# Patient Record
Sex: Male | Born: 1995
Health system: Southern US, Community
[De-identification: ages and names within clinical notes are randomized; demographics above are authoritative.]

## PROBLEM LIST (undated history)

## (undated) DIAGNOSIS — Z973 Presence of spectacles and contact lenses: Secondary | ICD-10-CM

## (undated) DIAGNOSIS — C73 Malignant neoplasm of thyroid gland: Secondary | ICD-10-CM

## (undated) HISTORY — PX: NO PAST SURGERIES: SHX2092

---

## 2017-09-15 DIAGNOSIS — M545 Low back pain: Secondary | ICD-10-CM | POA: Diagnosis not present

## 2017-09-15 DIAGNOSIS — M9903 Segmental and somatic dysfunction of lumbar region: Secondary | ICD-10-CM | POA: Diagnosis not present

## 2017-09-15 DIAGNOSIS — M9904 Segmental and somatic dysfunction of sacral region: Secondary | ICD-10-CM | POA: Diagnosis not present

## 2017-09-15 DIAGNOSIS — M9905 Segmental and somatic dysfunction of pelvic region: Secondary | ICD-10-CM | POA: Diagnosis not present

## 2017-11-18 DIAGNOSIS — A084 Viral intestinal infection, unspecified: Secondary | ICD-10-CM | POA: Diagnosis not present

## 2017-12-17 DIAGNOSIS — Z136 Encounter for screening for cardiovascular disorders: Secondary | ICD-10-CM | POA: Diagnosis not present

## 2017-12-17 DIAGNOSIS — Z789 Other specified health status: Secondary | ICD-10-CM | POA: Diagnosis not present

## 2017-12-17 DIAGNOSIS — Z0184 Encounter for antibody response examination: Secondary | ICD-10-CM | POA: Diagnosis not present

## 2017-12-17 DIAGNOSIS — Z0000 Encounter for general adult medical examination without abnormal findings: Secondary | ICD-10-CM | POA: Diagnosis not present

## 2017-12-19 ENCOUNTER — Other Ambulatory Visit: Payer: Self-pay | Admitting: Family Medicine

## 2017-12-19 DIAGNOSIS — R0989 Other specified symptoms and signs involving the circulatory and respiratory systems: Secondary | ICD-10-CM

## 2017-12-25 DIAGNOSIS — S81812A Laceration without foreign body, left lower leg, initial encounter: Secondary | ICD-10-CM | POA: Diagnosis not present

## 2017-12-31 ENCOUNTER — Ambulatory Visit
Admission: RE | Admit: 2017-12-31 | Discharge: 2017-12-31 | Disposition: A | Discharge status: Home / Self Care | Payer: BLUE CROSS/BLUE SHIELD | Source: Ambulatory Visit | Attending: Family Medicine | Admitting: Family Medicine

## 2017-12-31 DIAGNOSIS — R0989 Other specified symptoms and signs involving the circulatory and respiratory systems: Secondary | ICD-10-CM

## 2018-01-08 ENCOUNTER — Other Ambulatory Visit: Payer: Self-pay | Admitting: Family Medicine

## 2018-01-08 DIAGNOSIS — E041 Nontoxic single thyroid nodule: Secondary | ICD-10-CM

## 2018-01-15 ENCOUNTER — Ambulatory Visit
Admission: RE | Admit: 2018-01-15 | Discharge: 2018-01-15 | Disposition: A | Discharge status: Home / Self Care | Payer: BLUE CROSS/BLUE SHIELD | Source: Ambulatory Visit | Attending: Family Medicine | Admitting: Family Medicine

## 2018-01-15 DIAGNOSIS — E041 Nontoxic single thyroid nodule: Secondary | ICD-10-CM

## 2018-01-19 ENCOUNTER — Other Ambulatory Visit: Payer: Self-pay | Admitting: Family Medicine

## 2018-01-19 DIAGNOSIS — E041 Nontoxic single thyroid nodule: Secondary | ICD-10-CM

## 2018-01-27 ENCOUNTER — Ambulatory Visit
Admission: RE | Admit: 2018-01-27 | Discharge: 2018-01-27 | Disposition: A | Discharge status: Home / Self Care | Payer: BLUE CROSS/BLUE SHIELD | Source: Ambulatory Visit | Attending: Family Medicine | Admitting: Family Medicine

## 2018-01-27 ENCOUNTER — Other Ambulatory Visit (HOSPITAL_COMMUNITY)
Admission: RE | Admit: 2018-01-27 | Discharge: 2018-01-27 | Disposition: A | Discharge status: Home / Self Care | Payer: BLUE CROSS/BLUE SHIELD | Source: Ambulatory Visit | Attending: Student | Admitting: Student

## 2018-01-27 DIAGNOSIS — C73 Malignant neoplasm of thyroid gland: Secondary | ICD-10-CM | POA: Diagnosis not present

## 2018-01-27 DIAGNOSIS — E041 Nontoxic single thyroid nodule: Secondary | ICD-10-CM | POA: Diagnosis not present

## 2018-02-03 DIAGNOSIS — C73 Malignant neoplasm of thyroid gland: Secondary | ICD-10-CM | POA: Diagnosis not present

## 2018-03-04 ENCOUNTER — Ambulatory Visit: Payer: Self-pay | Admitting: Surgery

## 2018-03-04 DIAGNOSIS — C73 Malignant neoplasm of thyroid gland: Secondary | ICD-10-CM | POA: Diagnosis not present

## 2018-03-24 NOTE — Patient Instructions (Addendum)
Louis Mckinney  02-23-1996    Your procedure is scheduled on:  03-30-2018   Report to [facility] Main  Entrance, Report to admitting at  5:30 AM    Call this number if you have problems the morning of surgery [phone]                      Remember:   Do not eat food or drink liquids :After Midnight.                   BRUSH YOUR TEETH MORNING OF SURGERY AND RINSE YOUR MOUTH OUT, NO CHEWING GUM CANDY OR MINTS.       Take these medicines the morning of surgery with A SIP OF WATER:   Claritin                                You may not have any metal on your body including hair pins and              piercings               Do not wear jewelry, lotions, powders or perfumes, deodorant                          Men may shave face and neck.   Do not bring valuables to the hospital. [place].  Contacts, dentures or bridgework may not be worn into surgery.  Leave suitcase in the car. After surgery it may be brought to your room.     Special Instructions: N/A          _____________________________________________________________________             [facility] - Preparing for Surgery Before surgery, you can play an important role.  Because skin is not sterile, your skin needs to be as free of germs as possible.  You can reduce the number of germs on your skin by washing with CHG (chlorahexidine gluconate) soap before surgery.  CHG is an antiseptic cleaner which kills germs and bonds with the skin to continue killing germs even after washing. Please DO NOT use if you have an allergy to CHG or antibacterial soaps.  If your skin becomes reddened/irritated stop using the CHG and inform your nurse when you arrive at Short Stay. Do not shave (including legs and underarms) for at least 48 hours prior to the first CHG shower.  You may shave your face/neck. Please follow these instructions carefully:  1.  Shower  with CHG Soap the night before surgery and the  morning of Surgery.  2.  If you choose to wash your hair, wash your hair first as usual with your  normal  shampoo.  3.  After you shampoo, rinse your hair and body thoroughly to remove the  shampoo.                           4.  Use CHG as you would any other liquid soap.  You can apply chg directly  to the skin and wash  Gently with a scrungie or clean washcloth.  5.  Apply the CHG Soap to your body ONLY FROM THE NECK DOWN.   Do not use on face/ open                           Wound or open sores. Avoid contact with eyes, ears mouth and genitals (private parts).                       Wash face,  Genitals (private parts) with your normal soap.             6.  Wash thoroughly, paying special attention to the area where your surgery  will be performed.  7.  Thoroughly rinse your body with warm water from the neck down.  8.  DO NOT shower/wash with your normal soap after using and rinsing off  the CHG Soap.             9.  Pat yourself dry with a clean towel.            10.  Wear clean pajamas.            11.  Place clean sheets on your bed the night of your first shower and do not  sleep with pets. Day of Surgery : Do not apply any lotions/deodorants the morning of surgery.  Please wear clean clothes to the hospital/surgery center.  FAILURE TO FOLLOW THESE INSTRUCTIONS MAY RESULT IN THE CANCELLATION OF YOUR SURGERY PATIENT SIGNATURE_________________________________  NURSE SIGNATURE__________________________________  ________________________________________________________________________

## 2018-03-25 ENCOUNTER — Encounter (HOSPITAL_COMMUNITY)
Admission: RE | Admit: 2018-03-25 | Discharge: 2018-03-25 | Disposition: A | Discharge status: Home / Self Care | Payer: BLUE CROSS/BLUE SHIELD | Source: Ambulatory Visit | Attending: Surgery | Admitting: Surgery

## 2018-03-25 ENCOUNTER — Ambulatory Visit (HOSPITAL_COMMUNITY)
Admission: RE | Admit: 2018-03-25 | Discharge: 2018-03-25 | Disposition: A | Discharge status: Home / Self Care | Payer: BLUE CROSS/BLUE SHIELD | Source: Ambulatory Visit | Attending: Anesthesiology | Admitting: Anesthesiology

## 2018-03-25 ENCOUNTER — Encounter (HOSPITAL_COMMUNITY): Payer: Self-pay

## 2018-03-25 ENCOUNTER — Other Ambulatory Visit: Payer: Self-pay

## 2018-03-25 DIAGNOSIS — C73 Malignant neoplasm of thyroid gland: Secondary | ICD-10-CM | POA: Diagnosis not present

## 2018-03-25 DIAGNOSIS — Z01818 Encounter for other preprocedural examination: Secondary | ICD-10-CM | POA: Insufficient documentation

## 2018-03-25 HISTORY — DX: Presence of spectacles and contact lenses: Z97.3

## 2018-03-25 HISTORY — DX: Malignant neoplasm of thyroid gland: C73

## 2018-03-25 LAB — CBC
HEMATOCRIT: 44.5 % (ref 39.0–52.0)
Hemoglobin: 14.5 g/dL (ref 13.0–17.0)
MCH: 28.9 pg (ref 26.0–34.0)
MCHC: 32.6 g/dL (ref 30.0–36.0)
MCV: 88.8 fL (ref 80.0–100.0)
Platelets: 317 10*3/uL (ref 150–400)
RBC: 5.01 MIL/uL (ref 4.22–5.81)
RDW: 12.6 % (ref 11.5–15.5)
WBC: 10.1 10*3/uL (ref 4.0–10.5)
nRBC: 0 % (ref 0.0–0.2)

## 2018-03-25 MED ORDER — CHLORHEXIDINE GLUCONATE CLOTH 2 % EX PADS
6.0000 | MEDICATED_PAD | Freq: Once | CUTANEOUS | Status: DC
  Filled 2018-03-25: qty 6

## 2018-03-25 NOTE — Progress Notes (Signed)
Final CXR dated 03-25-2018 in epic.

## 2018-03-29 ENCOUNTER — Encounter (HOSPITAL_COMMUNITY): Payer: Self-pay | Admitting: Surgery

## 2018-03-29 DIAGNOSIS — C73 Malignant neoplasm of thyroid gland: Secondary | ICD-10-CM | POA: Diagnosis present

## 2018-03-29 NOTE — H&P (Signed)
General Surgery [facility]  Louis Mckinney Documented: 03/04/2018 11:26 AM Location: [facility] Patient #: [id number] DOB: 08-22-1995 Single / Language: Louis Mckinney / Race: White Male   History of Present Illness Louis Regal MD; 03/04/2018 12:09 PM) The patient is a 22 year old male who presents with thyroid cancer.  CHIEF COMPLAINT: papillary thyroid carcinoma  Patient is referred by Dr. Aura Dials for surgical evaluation and management of papillary thyroid carcinoma. Patient had been noted on routine physical examination to have a carotid bruit. He was sent for duplex examination which found a normal vascular examination but an incidental finding of a thyroid nodule. Patient subsequently underwent thyroid ultrasound on January 15, 2018. This showed a mildly enlarged thyroid gland which was moderately heterogeneous. There was a dominant nodule in the left inferior pole measuring 1.4 cm that was felt to be highly suspicious. Patient underwent fine-needle aspiration biopsy on January 27, 2018. This showed findings consistent with papillary thyroid carcinoma. Patient has no prior history of thyroid disease. He has never been on thyroid medication. He has had no prior head or neck surgery. There is no family history of thyroid cancer. There is no family history of other endocrine neoplasms. Patient presents today accompanied by his father to discuss thyroid surgery.   Problem List/Past Medical Louis Regal, MD; 03/04/2018 12:12 PM) PAPILLARY THYROID CARCINOMA (C73)   Past Surgical History [name]; 03/04/2018 11:26 AM) No pertinent past surgical history   Diagnostic Studies History [name]; 03/04/2018 11:26 AM) Colonoscopy  never  Allergies [name]; 03/04/2018 11:26 AM) No Known Drug Allergies [03/04/2018]: Allergies Reconciled   Medication History [name]; 03/04/2018 11:48 AM) No Current  Medications Medications Reconciled  Social History [name]; 03/04/2018 11:26 AM) Caffeine use  Coffee. No alcohol use  No drug use  Tobacco use  Never smoker.  Family History [name]; 03/04/2018 11:26 AM) Family history unknown  First Degree Relatives   Other Problems Louis Regal, MD; 03/04/2018 12:12 PM) No pertinent past medical history     Review of Systems [name]; 03/04/2018 11:26 AM) General Not Present- Appetite Loss, Chills, Fatigue, Fever, Night Sweats, Weight Gain and Weight Loss. Skin Not Present- Change in Wart/Mole, Dryness, Hives, Jaundice, New Lesions, Non-Healing Wounds, Rash and Ulcer. HEENT Present- Seasonal Allergies and Wears glasses/contact lenses. Not Present- Earache, Hearing Loss, Hoarseness, Nose Bleed, Oral Ulcers, Ringing in the Ears, Sinus Pain, Sore Throat, Visual Disturbances and Yellow Eyes. Respiratory Not Present- Bloody sputum, Chronic Cough, Difficulty Breathing, Snoring and Wheezing. Breast Not Present- Breast Mass, Breast Pain, Nipple Discharge and Skin Changes. Cardiovascular Not Present- Chest Pain, Difficulty Breathing Lying Down, Leg Cramps, Palpitations, Rapid Heart Rate, Shortness of Breath and Swelling of Extremities. Gastrointestinal Not Present- Abdominal Pain, Bloating, Bloody Stool, Change in Bowel Habits, Chronic diarrhea, Constipation, Difficulty Swallowing, Excessive gas, Gets full quickly at meals, Hemorrhoids, Indigestion, Nausea, Rectal Pain and Vomiting. Male Genitourinary Not Present- Blood in Urine, Change in Urinary Stream, Frequency, Impotence, Nocturia, Painful Urination, Urgency and Urine Leakage. Musculoskeletal Not Present- Back Pain, Joint Pain, Joint Stiffness, Muscle Pain, Muscle Weakness and Swelling of Extremities. Neurological Not Present- Decreased Memory, Fainting, Headaches, Numbness, Seizures, Tingling, Tremor, Trouble walking and Weakness. Psychiatric Not Present- Anxiety, Bipolar,  Change in Sleep Pattern, Depression, Fearful and Frequent crying. Endocrine Not Present- Cold Intolerance, Excessive Hunger, Hair Changes, Heat Intolerance, Hot flashes and New Diabetes. Hematology Not Present- Blood Thinners, Easy Bruising, Excessive bleeding, Gland problems, HIV and Persistent  Infections.  Vitals [name]; 03/04/2018 11:27 AM) 03/04/2018 11:27 AM Weight: 189.5 lb Height: 71in Body Surface Area: 2.06 m Body Mass Index: 26.43 kg/m  Temp.: 98.19F(Oral)  Pulse: 72 (Regular)  BP: 122/80 (Sitting, Left Arm, Standard)       Physical Exam Louis Regal MD; 03/04/2018 12:10 PM) The physical exam findings are as follows: Note:See vital signs recorded above  GENERAL APPEARANCE Development: normal Nutritional status: normal Gross deformities: none  SKIN Rash, lesions, ulcers: none Induration, erythema: none Nodules: none palpable  EYES Conjunctiva and lids: normal Pupils: equal and reactive Iris: normal bilaterally  EARS, NOSE, MOUTH, THROAT External ears: no lesion or deformity External nose: no lesion or deformity Hearing: grossly normal Lips: no lesion or deformity Dentition: normal for age Oral mucosa: moist  NECK Symmetric: yes Trachea: midline Thyroid: There is a subtle nodule palpable with swallowing in the inferior pole of the left thyroid lobe. It is mobile. It is nontender. Right thyroid lobe is without palpable abnormality. Entire gland is slightly enlarged but relatively soft without dominant or discrete mass.  CHEST Respiratory effort: normal Retraction or accessory muscle use: no Breath sounds: normal bilaterally Rales, rhonchi, wheeze: none  CARDIOVASCULAR Auscultation: regular rhythm, normal rate Murmurs: none Pulses: carotid and radial pulse 2+ palpable Lower extremity edema: none Lower extremity varicosities: none  MUSCULOSKELETAL Station and gait: normal Digits and nails: no clubbing or cyanosis Muscle  strength: grossly normal all extremities Range of motion: grossly normal all extremities Deformity: none  LYMPHATIC Cervical: none palpable Supraclavicular: none palpable  PSYCHIATRIC Oriented to person, place, and time: yes Mood and affect: normal for situation Judgment and insight: appropriate for situation    Assessment & Plan Louis Regal MD; 03/04/2018 12:12 PM) PAPILLARY THYROID CARCINOMA (C73) Current Plans Pt Education - Pamphlet Given - The Thyroid Book: discussed with patient and provided information. Patient presents on referral from his primary care physician for evaluation of newly diagnosed papillary thyroid carcinoma. Patient is accompanied by his father. They're provided with written literature on thyroid surgery to review at home.  Patient has a 1.4 cm papillary thyroid carcinoma in the inferior pole of the left thyroid lobe. However the rest of the thyroid gland appears heterogeneous on ultrasound examination and is moderately enlarged. Based on these findings, I recommended proceeding with total thyroidectomy with limited central compartment lymph node dissection. We discussed the procedure, the surgical incision, the hospital stay, and the postoperative recovery. We have discussed potential risk of the surgery including injury to recurrent laryngeal nerves and injury to parathyroid glands. We have discussed the need for lifelong thyroid hormone replacement. We have discussed the potential need for radioactive iodine treatment. Patient understands and wishes to proceed with surgery in the near future.  We will arrange for her postoperative endocrinology evaluation with Dr. Delrae Rend.  The risks and benefits of the procedure have been discussed at length with the patient. The patient understands the proposed procedure, potential alternative treatments, and the course of recovery to be expected. All of the patient's questions have been answered at this time.  The patient wishes to proceed with surgery.   Armandina Gemma, [place] Office: [phone]

## 2018-03-30 ENCOUNTER — Ambulatory Visit (HOSPITAL_COMMUNITY): Payer: BLUE CROSS/BLUE SHIELD | Admitting: Anesthesiology

## 2018-03-30 ENCOUNTER — Other Ambulatory Visit: Payer: Self-pay

## 2018-03-30 ENCOUNTER — Encounter (HOSPITAL_COMMUNITY): Payer: Self-pay | Admitting: Emergency Medicine

## 2018-03-30 ENCOUNTER — Observation Stay (HOSPITAL_COMMUNITY)
Admission: RE | Admit: 2018-03-30 | Discharge: 2018-03-31 | Disposition: A | Discharge status: Home / Self Care | Payer: BLUE CROSS/BLUE SHIELD | Source: Ambulatory Visit | Attending: Surgery | Admitting: Surgery

## 2018-03-30 ENCOUNTER — Encounter (HOSPITAL_COMMUNITY)
Admission: RE | Disposition: A | Discharge status: Home / Self Care | Payer: Self-pay | Source: Ambulatory Visit | Attending: Surgery

## 2018-03-30 DIAGNOSIS — E063 Autoimmune thyroiditis: Secondary | ICD-10-CM | POA: Diagnosis not present

## 2018-03-30 DIAGNOSIS — C73 Malignant neoplasm of thyroid gland: Principal | ICD-10-CM | POA: Insufficient documentation

## 2018-03-30 DIAGNOSIS — F17200 Nicotine dependence, unspecified, uncomplicated: Secondary | ICD-10-CM | POA: Diagnosis not present

## 2018-03-30 HISTORY — PX: THYROIDECTOMY: SHX17

## 2018-03-30 SURGERY — THYROIDECTOMY
Anesthesia: General | Site: Neck

## 2018-03-30 MED ORDER — OXYCODONE HCL 5 MG/5ML PO SOLN: 5.0000 mg | Freq: Once | ORAL | Status: DC | PRN

## 2018-03-30 MED ORDER — LACTATED RINGERS IV SOLN
INTRAVENOUS | Status: DC
  Administered 2018-03-30: 07:00:00 via INTRAVENOUS

## 2018-03-30 MED ORDER — CALCIUM CARBONATE 1250 (500 CA) MG PO TABS
2.0000 | ORAL_TABLET | Freq: Three times a day (TID) | ORAL | Status: DC
  Administered 2018-03-30 – 2018-03-31 (×3): 1000 mg via ORAL
  Filled 2018-03-30 (×4): qty 1

## 2018-03-30 MED ORDER — TRAMADOL HCL 50 MG PO TABS
50.0000 mg | ORAL_TABLET | Freq: Four times a day (QID) | ORAL | Status: DC | PRN
  Administered 2018-03-30: 50 mg via ORAL
  Filled 2018-03-30: qty 1

## 2018-03-30 MED ORDER — CEFAZOLIN SODIUM-DEXTROSE 2-4 GM/100ML-% IV SOLN
2.0000 g | INTRAVENOUS | Status: AC
  Administered 2018-03-30: 2 g via INTRAVENOUS
  Filled 2018-03-30: qty 100

## 2018-03-30 MED ORDER — HYDROMORPHONE HCL 1 MG/ML IJ SOLN
INTRAMUSCULAR | Status: AC
  Filled 2018-03-30: qty 1

## 2018-03-30 MED ORDER — OXYCODONE HCL 5 MG PO TABS: 5.0000 mg | ORAL_TABLET | Freq: Once | ORAL | Status: DC | PRN

## 2018-03-30 MED ORDER — DEXAMETHASONE SODIUM PHOSPHATE 10 MG/ML IJ SOLN
INTRAMUSCULAR | Status: DC | PRN
  Administered 2018-03-30: 10 mg via INTRAVENOUS

## 2018-03-30 MED ORDER — ONDANSETRON HCL 4 MG/2ML IJ SOLN: 4.0000 mg | Freq: Four times a day (QID) | INTRAMUSCULAR | Status: DC | PRN

## 2018-03-30 MED ORDER — LIDOCAINE 2% (20 MG/ML) 5 ML SYRINGE
INTRAMUSCULAR | Status: DC | PRN
  Administered 2018-03-30: 100 mg via INTRAVENOUS

## 2018-03-30 MED ORDER — MIDAZOLAM HCL 2 MG/2ML IJ SOLN
INTRAMUSCULAR | Status: AC
  Filled 2018-03-30: qty 2

## 2018-03-30 MED ORDER — LACTATED RINGERS IV SOLN
INTRAVENOUS | Status: DC
  Administered 2018-03-30: 06:00:00 via INTRAVENOUS

## 2018-03-30 MED ORDER — ROCURONIUM BROMIDE 10 MG/ML (PF) SYRINGE
PREFILLED_SYRINGE | INTRAVENOUS | Status: DC | PRN
  Administered 2018-03-30 (×3): 10 mg via INTRAVENOUS
  Administered 2018-03-30: 50 mg via INTRAVENOUS

## 2018-03-30 MED ORDER — SUGAMMADEX SODIUM 200 MG/2ML IV SOLN
INTRAVENOUS | Status: AC
  Filled 2018-03-30: qty 2

## 2018-03-30 MED ORDER — SUGAMMADEX SODIUM 200 MG/2ML IV SOLN
INTRAVENOUS | Status: DC | PRN
  Administered 2018-03-30: 190 mg via INTRAVENOUS

## 2018-03-30 MED ORDER — HYDROCODONE-ACETAMINOPHEN 5-325 MG PO TABS
1.0000 | ORAL_TABLET | ORAL | Status: DC | PRN
  Administered 2018-03-30: 2 via ORAL
  Administered 2018-03-30: 1 via ORAL
  Filled 2018-03-30: qty 2
  Filled 2018-03-30: qty 1

## 2018-03-30 MED ORDER — DEXAMETHASONE SODIUM PHOSPHATE 10 MG/ML IJ SOLN
INTRAMUSCULAR | Status: AC
  Filled 2018-03-30: qty 2

## 2018-03-30 MED ORDER — FENTANYL CITRATE (PF) 100 MCG/2ML IJ SOLN
INTRAMUSCULAR | Status: DC | PRN
  Administered 2018-03-30: 100 ug via INTRAVENOUS
  Administered 2018-03-30 (×3): 50 ug via INTRAVENOUS
  Administered 2018-03-30 (×2): 25 ug via INTRAVENOUS

## 2018-03-30 MED ORDER — PROPOFOL 10 MG/ML IV BOLUS
INTRAVENOUS | Status: AC
  Filled 2018-03-30: qty 40

## 2018-03-30 MED ORDER — LIDOCAINE 2% (20 MG/ML) 5 ML SYRINGE
INTRAMUSCULAR | Status: AC
  Filled 2018-03-30: qty 5

## 2018-03-30 MED ORDER — LIDOCAINE 2% (20 MG/ML) 5 ML SYRINGE
INTRAMUSCULAR | Status: AC
  Filled 2018-03-30: qty 10

## 2018-03-30 MED ORDER — ACETAMINOPHEN 650 MG RE SUPP
650.0000 mg | Freq: Four times a day (QID) | RECTAL | Status: DC | PRN
  Filled 2018-03-30: qty 1

## 2018-03-30 MED ORDER — PROMETHAZINE HCL 25 MG/ML IJ SOLN: 6.2500 mg | INTRAMUSCULAR | Status: DC | PRN

## 2018-03-30 MED ORDER — ONDANSETRON HCL 4 MG/2ML IJ SOLN
INTRAMUSCULAR | Status: DC | PRN
  Administered 2018-03-30: 4 mg via INTRAVENOUS

## 2018-03-30 MED ORDER — ROCURONIUM BROMIDE 100 MG/10ML IV SOLN
INTRAVENOUS | Status: AC
  Filled 2018-03-30: qty 1

## 2018-03-30 MED ORDER — FENTANYL CITRATE (PF) 100 MCG/2ML IJ SOLN
INTRAMUSCULAR | Status: AC
  Filled 2018-03-30: qty 2

## 2018-03-30 MED ORDER — MEPERIDINE HCL 50 MG/ML IJ SOLN: 6.2500 mg | INTRAMUSCULAR | Status: DC | PRN

## 2018-03-30 MED ORDER — HYDROMORPHONE HCL 1 MG/ML IJ SOLN
0.2500 mg | INTRAMUSCULAR | Status: DC | PRN
  Administered 2018-03-30 (×4): 0.5 mg via INTRAVENOUS

## 2018-03-30 MED ORDER — ACETAMINOPHEN 325 MG PO TABS: 650.0000 mg | ORAL_TABLET | Freq: Four times a day (QID) | ORAL | Status: DC | PRN

## 2018-03-30 MED ORDER — 0.9 % SODIUM CHLORIDE (POUR BTL) OPTIME
TOPICAL | Status: DC | PRN
  Administered 2018-03-30: 1000 mL

## 2018-03-30 MED ORDER — PROPOFOL 10 MG/ML IV BOLUS
INTRAVENOUS | Status: DC | PRN
  Administered 2018-03-30: 200 mg via INTRAVENOUS

## 2018-03-30 MED ORDER — ONDANSETRON HCL 4 MG/2ML IJ SOLN
INTRAMUSCULAR | Status: AC
  Filled 2018-03-30: qty 4

## 2018-03-30 MED ORDER — KCL IN DEXTROSE-NACL 20-5-0.45 MEQ/L-%-% IV SOLN
INTRAVENOUS | Status: DC
  Administered 2018-03-30 – 2018-03-31 (×2): via INTRAVENOUS
  Filled 2018-03-30 (×2): qty 1000

## 2018-03-30 MED ORDER — HYDROMORPHONE HCL 1 MG/ML IJ SOLN: 1.0000 mg | INTRAMUSCULAR | Status: DC | PRN

## 2018-03-30 MED ORDER — MIDAZOLAM HCL 5 MG/5ML IJ SOLN
INTRAMUSCULAR | Status: DC | PRN
  Administered 2018-03-30: 2 mg via INTRAVENOUS

## 2018-03-30 MED ORDER — ONDANSETRON 4 MG PO TBDP: 4.0000 mg | ORAL_TABLET | Freq: Four times a day (QID) | ORAL | Status: DC | PRN

## 2018-03-30 SURGICAL SUPPLY — 33 items
ATTRACTOMAT 16X20 MAGNETIC DRP (DRAPES) ×2 IMPLANT
BLADE SURG 15 STRL LF DISP TIS (BLADE) ×1 IMPLANT
BLADE SURG 15 STRL SS (BLADE) ×1
CHLORAPREP W/TINT 26ML (MISCELLANEOUS) ×4 IMPLANT
CLIP VESOCCLUDE MED 6/CT (CLIP) ×6 IMPLANT
CLIP VESOCCLUDE SM WIDE 6/CT (CLIP) ×8 IMPLANT
COVER SURGICAL LIGHT HANDLE (MISCELLANEOUS) ×2 IMPLANT
COVER WAND RF STERILE (DRAPES) ×2 IMPLANT
DISSECTOR ROUND CHERRY 3/8 STR (MISCELLANEOUS) IMPLANT
DRAPE LAPAROTOMY T 98X78 PEDS (DRAPES) ×2 IMPLANT
ELECT PENCIL ROCKER SW 15FT (MISCELLANEOUS) ×2 IMPLANT
ELECT REM PT RETURN 15FT ADLT (MISCELLANEOUS) ×2 IMPLANT
GAUZE 4X4 16PLY RFD (DISPOSABLE) ×2 IMPLANT
GAUZE SPONGE 4X4 12PLY STRL (GAUZE/BANDAGES/DRESSINGS) ×2 IMPLANT
GLOVE SURG ORTHO 8.0 STRL STRW (GLOVE) ×2 IMPLANT
GOWN STRL REUS W/TWL XL LVL3 (GOWN DISPOSABLE) ×4 IMPLANT
HEMOSTAT SURGICEL 2X4 FIBR (HEMOSTASIS) ×2 IMPLANT
ILLUMINATOR WAVEGUIDE N/F (MISCELLANEOUS) ×2 IMPLANT
KIT BASIN OR (CUSTOM PROCEDURE TRAY) ×2 IMPLANT
PACK BASIC VI WITH GOWN DISP (CUSTOM PROCEDURE TRAY) ×2 IMPLANT
POWDER SURGICEL 3.0 GRAM (HEMOSTASIS) IMPLANT
SHEARS HARMONIC 9CM CVD (BLADE) ×2 IMPLANT
STRIP CLOSURE SKIN 1/2X4 (GAUZE/BANDAGES/DRESSINGS) ×2 IMPLANT
SUT MNCRL AB 4-0 PS2 18 (SUTURE) ×2 IMPLANT
SUT SILK 2 0 (SUTURE)
SUT SILK 2-0 18XBRD TIE 12 (SUTURE) IMPLANT
SUT SILK 3 0 (SUTURE)
SUT SILK 3-0 18XBRD TIE 12 (SUTURE) IMPLANT
SUT VIC AB 3-0 SH 18 (SUTURE) ×4 IMPLANT
SYR BULB IRRIGATION 50ML (SYRINGE) ×2 IMPLANT
TOWEL OR 17X26 10 PK STRL BLUE (TOWEL DISPOSABLE) ×2 IMPLANT
TOWEL OR NON WOVEN STRL DISP B (DISPOSABLE) ×2 IMPLANT
YANKAUER SUCT BULB TIP 10FT TU (MISCELLANEOUS) ×2 IMPLANT

## 2018-03-30 NOTE — Anesthesia Procedure Notes (Signed)
Procedure Name: Intubation Date/Time: 03/30/2018 7:27 AM Performed by: Lavina Hamman, CRNA Pre-anesthesia Checklist: Patient identified, Emergency Drugs available, Suction available, Patient being monitored and Timeout performed Patient Re-evaluated:Patient Re-evaluated prior to induction Oxygen Delivery Method: Circle system utilized Preoxygenation: Pre-oxygenation with 100% oxygen Induction Type: IV induction Ventilation: Mask ventilation without difficulty Laryngoscope Size: Mac and 4 Grade View: Grade II Tube type: Oral Tube size: 7.0 mm Number of attempts: 1 Airway Equipment and Method: Stylet Placement Confirmation: ETT inserted through vocal cords under direct vision,  positive ETCO2,  CO2 detector and breath sounds checked- equal and bilateral Secured at: 22 cm Tube secured with: Tape Dental Injury: Teeth and Oropharynx as per pre-operative assessment

## 2018-03-30 NOTE — Transfer of Care (Signed)
Immediate Anesthesia Transfer of Care Note  Patient: Louis Mckinney  Procedure(s) Performed: Procedure(s): TOTAL THYROIDECTOMY WITH LIMITED LYMPH NODE DISSECTION (N/A)  Patient Location: PACU  Anesthesia Type:General  Level of Consciousness:  sedated, patient cooperative and responds to stimulation  Airway & Oxygen Therapy:Patient Spontanous Breathing and Patient connected to face mask oxgen  Post-op Assessment:  Report given to PACU RN and Post -op Vital signs reviewed and stable  Post vital signs:  Reviewed and stable  Last Vitals:  Vitals:   03/30/18 0611  BP: 132/76  Pulse: (!) 55  Resp: 16  Temp: 37.1 C  SpO2: 029%    Complications: No apparent anesthesia complications

## 2018-03-30 NOTE — Anesthesia Preprocedure Evaluation (Signed)
Anesthesia Evaluation  Patient identified by MRN, date of birth, ID band Patient awake    Reviewed: Allergy & Precautions, NPO status , Patient's Chart, lab work & pertinent test results  Airway Mallampati: II  TM Distance: >3 FB Neck ROM: Full    Dental no notable dental hx.    Pulmonary neg pulmonary ROS, Current Smoker,    Pulmonary exam normal breath sounds clear to auscultation       Cardiovascular negative cardio ROS Normal cardiovascular exam Rhythm:Regular Rate:Normal     Neuro/Psych negative neurological ROS  negative psych ROS   GI/Hepatic negative GI ROS, Neg liver ROS,   Endo/Other  negative endocrine ROS  Renal/GU negative Renal ROS  negative genitourinary   Musculoskeletal negative musculoskeletal ROS (+)   Abdominal   Peds negative pediatric ROS (+)  Hematology negative hematology ROS (+)   Anesthesia Other Findings Thyroid Cancer  Reproductive/Obstetrics negative OB ROS                             Anesthesia Physical Anesthesia Plan  ASA: II  Anesthesia Plan: General   Post-op Pain Management:    Induction: Intravenous  PONV Risk Score and Plan: 1 and Ondansetron  Airway Management Planned: Oral ETT  Additional Equipment:   Intra-op Plan:   Post-operative Plan: Extubation in OR  Informed Consent: I have reviewed the patients History and Physical, chart, labs and discussed the procedure including the risks, benefits and alternatives for the proposed anesthesia with the patient or authorized representative who has indicated his/her understanding and acceptance.   Dental advisory given  Plan Discussed with: CRNA  Anesthesia Plan Comments:         Anesthesia Quick Evaluation

## 2018-03-30 NOTE — Op Note (Signed)
Procedure Note  Pre-operative Diagnosis:  Papillary thyroid carcinoma  Post-operative Diagnosis:  same  Surgeon:  Armandina Gemma, MD  Assistant:  none   Procedure:  Total thyroidectomy with limited central compartment lymph node dissection  Anesthesia:  General  Estimated Blood Loss:  minimal  Drains: none         Specimen: thyroid to pathology  Indications:  Patient is referred by Dr. Aura Dials for surgical evaluation and management of papillary thyroid carcinoma. Patient had been noted on routine physical examination to have a carotid bruit. He was sent for duplex examination which found a normal vascular examination but an incidental finding of a thyroid nodule. Patient subsequently underwent thyroid ultrasound on January 15, 2018. This showed a mildly enlarged thyroid gland which was moderately heterogeneous. There was a dominant nodule in the left inferior pole measuring 1.4 cm that was felt to be highly suspicious. Patient underwent fine-needle aspiration biopsy on January 27, 2018. This showed findings consistent with papillary thyroid carcinoma.   Procedure Details: Procedure was done in OR #1 at the [facility].  The patient was brought to the operating room and placed in a supine position on the operating room table.  Following administration of general anesthesia, the patient was positioned and then prepped and draped in the usual aseptic fashion.  After ascertaining that an adequate level of anesthesia had been achieved, a Kocher incision was made with #15 blade.  Dissection was carried through subcutaneous tissues and platysma. Hemostasis was achieved with the electrocautery.  Skin flaps were elevated cephalad and caudad from the thyroid notch to the sternal notch.  The [place] self-retaining retractor was placed for exposure.  Strap muscles were incised in the midline and dissection was begun on the left side.  Strap muscles were reflected laterally.  Left thyroid  lobe was mild enlarged and firm.  There was a dominant nodule in the inferior pole.  The left lobe was gently mobilized with blunt dissection.  Superior pole vessels were dissected out and divided individually between small and medium Ligaclips with the Harmonic scalpel.  The thyroid lobe was rolled anteriorly.  Branches of the inferior thyroid artery were divided between small Ligaclips with the Harmonic scalpel.  Inferior venous tributaries were divided between Ligaclips.  Both the superior and inferior parathyroid glands were identified and preserved on their vascular pedicles.  The recurrent laryngeal nerve was identified and preserved along its course.  The ligament of Gwenlyn Found was released with the electrocautery and the gland was mobilized onto the anterior trachea. Isthmus was mobilized across the midline.  There was a large pyramidal lobe present which was dissected off of the thyroid cartilage and resected with the thyroid isthmus.  Dry pack was placed in the left neck.  Next, the right thyroid lobe was gently mobilized with blunt dissection.  Right thyroid lobe was enlarged and moderately firm.  Superior pole vessels were dissected out and divided between small and medium Ligaclips with the Harmonic scalpel.  Superior parathyroid was identified and preserved.  Inferior venous tributaries were divided between medium Ligaclips with the Harmonic scalpel.  The right thyroid lobe was rolled anteriorly and the branches of the inferior thyroid artery divided between small Ligaclips.  The right recurrent laryngeal nerve was identified and preserved along its course.  The ligament of Gwenlyn Found was released with the electrocautery.  The right thyroid lobe was mobilized onto the anterior trachea and the remainder of the thyroid was dissected off the anterior trachea and the thyroid  was completely excised.  A suture was used to mark the left lobe. The entire thyroid gland was submitted to pathology for review.  Several  enlarged lymph nodes were dissected out of the central compartment with the electrocautery used for hemostasis.  They were submitted as a group to pathology for review.  No obvious signs of malignancy were noted.  The neck was irrigated with warm saline.  Fibrillar was placed throughout the operative field.  Strap muscles were reapproximated in the midline with interrupted 3-0 Vicryl sutures.  Platysma was closed with interrupted 3-0 Vicryl sutures.  Skin was closed with a running 4-0 Monocryl subcuticular suture.  Wound was washed and dried and steri-strips were applied.  Dry gauze dressing was placed.  The patient was awakened from anesthesia and brought to the recovery room.  The patient tolerated the procedure well.   Armandina Gemma, MD [facility] Office: [phone]

## 2018-03-30 NOTE — Anesthesia Postprocedure Evaluation (Signed)
Anesthesia Post Note  Patient: Louis Mckinney  Procedure(s) Performed: TOTAL THYROIDECTOMY WITH LIMITED LYMPH NODE DISSECTION (N/A Neck)     Patient location during evaluation: PACU Anesthesia Type: General Level of consciousness: awake and alert Pain management: pain level controlled Vital Signs Assessment: post-procedure vital signs reviewed and stable Respiratory status: spontaneous breathing, nonlabored ventilation and respiratory function stable Cardiovascular status: blood pressure returned to baseline and stable Postop Assessment: no apparent nausea or vomiting Anesthetic complications: no    Last Vitals:  Vitals:   03/30/18 1030 03/30/18 1043  BP: (!) 150/96 (!) 145/97  Pulse: 75 87  Resp: 16 16  Temp: 37 C 36.8 C  SpO2: 98% 100%    Last Pain:  Vitals:   03/30/18 1110  TempSrc:   PainSc: [place]

## 2018-03-30 NOTE — Interval H&P Note (Signed)
History and Physical Interval Note:  03/30/2018 7:06 AM  Louis Mckinney  has presented today for surgery, with the diagnosis of PAPILLARY THYROID CARCINOMA  The various methods of treatment have been discussed with the patient and family. After consideration of risks, benefits and other options for treatment, the patient has consented to  Procedure(s): TOTAL THYROIDECTOMY WITH LIMITED LYMPH NODE DISSECTION (N/A) as a surgical intervention .  The patient's history has been reviewed, patient examined, no change in status, stable for surgery.  I have reviewed the patient's chart and labs.  Questions were answered to the patient's satisfaction.     Louis Mckinney Jerilynn Mages

## 2018-03-31 ENCOUNTER — Encounter (HOSPITAL_COMMUNITY): Payer: Self-pay | Admitting: Surgery

## 2018-03-31 DIAGNOSIS — E063 Autoimmune thyroiditis: Secondary | ICD-10-CM | POA: Diagnosis not present

## 2018-03-31 DIAGNOSIS — C73 Malignant neoplasm of thyroid gland: Secondary | ICD-10-CM | POA: Diagnosis not present

## 2018-03-31 DIAGNOSIS — F17200 Nicotine dependence, unspecified, uncomplicated: Secondary | ICD-10-CM | POA: Diagnosis not present

## 2018-03-31 LAB — BASIC METABOLIC PANEL
Anion gap: 10 (ref 5–15)
BUN: 9 mg/dL (ref 6–20)
CO2: 27 mmol/L (ref 22–32)
CREATININE: 0.7 mg/dL (ref 0.61–1.24)
Calcium: 8 mg/dL — ABNORMAL LOW (ref 8.9–10.3)
Chloride: 103 mmol/L (ref 98–111)
GFR calc Af Amer: >60 mL/min (ref >60)
Glucose, Bld: 121 mg/dL — ABNORMAL HIGH (ref 70–99)
Potassium: 4 mmol/L (ref 3.5–5.1)
Sodium: 140 mmol/L (ref 135–145)

## 2018-03-31 MED ORDER — CALCITRIOL 0.25 MCG PO CAPS: 0.2500 ug | ORAL_CAPSULE | Freq: Two times a day (BID) | ORAL | 0 refills | Status: AC

## 2018-03-31 MED ORDER — CALCIUM CARBONATE ANTACID 500 MG PO CHEW: 2.0000 | CHEWABLE_TABLET | Freq: Three times a day (TID) | ORAL | 1 refills | Status: AC

## 2018-03-31 MED ORDER — SODIUM CHLORIDE 0.9 % IV SOLN
2.0000 g | INTRAVENOUS | Status: AC
  Administered 2018-03-31: 2 g via INTRAVENOUS
  Filled 2018-03-31: qty 20

## 2018-03-31 MED ORDER — HYDROCODONE-ACETAMINOPHEN 5-325 MG PO TABS: 1.0000 | ORAL_TABLET | ORAL | 0 refills | Status: AC | PRN

## 2018-03-31 MED ORDER — LEVOTHYROXINE SODIUM 100 MCG PO TABS: 100.0000 ug | ORAL_TABLET | Freq: Every day | ORAL | 3 refills | Status: AC

## 2018-03-31 NOTE — Discharge Summary (Signed)
Physician Discharge Summary [facility]  Patient ID: Louis Mckinney MRN: [id number] DOB/AGE: 1995-09-09 22 y.o.  Admit date: 03/30/2018 Discharge date: 03/31/2018  Admission Diagnoses:  Papillary thyroid carcinoma  Discharge Diagnoses:  Principal Problem:   Papillary thyroid carcinoma [facility])   Discharged Condition: good  Hospital Course: Patient was admitted for observation following thyroid surgery.  Post op course was uncomplicated.  Pain was well controlled.  Tolerated diet.  Post op calcium level on morning following surgery was 8.0 mg/dl.  Calcium gluconate 2 gm IVPB was administered prior to discharge.  Patient was prepared for discharge home on POD#1.  Consults: None  Treatments: surgery: total thyroidectomy with limited lymph node dissection  Discharge Exam: Blood pressure 125/82, pulse (!) 55, temperature 98.2 F (36.8 C), temperature source Oral, resp. rate 16, height 5\' 11"  (1.803 m), weight 85.3 kg, SpO2 100 %. HEENT - clear Neck - wound dry and intact; mild STS; voice normal Chest - clear bilaterally Cor - RRR  Disposition: Home  Discharge Instructions    Apply dressing   Complete by:  As directed    Apply light gauze dressing to wound before discharge home today.   Diet - low sodium heart healthy   Complete by:  As directed    Discharge instructions   Complete by:  As directed    [facility]  THYROID & PARATHYROID SURGERY:  POST-OP INSTRUCTIONS  Always review your discharge instruction sheet from the facility where your surgery was performed.  A prescription for pain medication may be given to you upon discharge.  Take your pain medication as prescribed.  If narcotic pain medicine is not needed, then you may take acetaminophen (Tylenol) or ibuprofen (Advil) as needed.  Take your usually prescribed medications unless otherwise directed.  If you need a refill on your pain medication, please contact our office  during regular business hours.  Prescriptions cannot be processed by our office after 5 pm or on weekends.  Start with a light diet upon arrival home, such as soup and crackers or toast.  Be sure to drink plenty of fluids daily.  Resume your normal diet the day after surgery.  Most patients will experience some swelling and bruising on the chest and neck area.  Ice packs will help.  Swelling and bruising can take several days to resolve.   It is common to experience some constipation after surgery.  Increasing fluid intake and taking a stool softener (Colace) will usually help or prevent this problem.  A mild laxative (Milk of Magnesia or Miralax) should be taken according to package directions if there has been no bowel movement after 48 hours.  You have steri-strips and a gauze dressing over your incision.  You may remove the gauze bandage on the second day after surgery, and you may shower at that time.  Leave your steri-strips (small skin tapes) in place directly over the incision.  These strips should remain on the skin for 5-7 days and then be removed.  You may get them wet in the shower and pat them dry.  You may resume regular (light) daily activities beginning the next day (such as daily self-care, walking, climbing stairs) gradually increasing activities as tolerated.  You may have sexual intercourse when it is comfortable.  Refrain from any heavy lifting or straining until approved by your doctor.  You may drive when you no longer are taking prescription pain medication, you can comfortably wear a seatbelt, and you  can safely maneuver your car and apply brakes.  You should see your doctor in the office for a follow-up appointment approximately three weeks after your surgery.  Make sure that you call for this appointment within a day or two after you arrive home to insure a convenient appointment time.  WHEN TO CALL YOUR DOCTOR: -- Fever greater than 101.5 -- Inability to urinate -- Nausea  and/or vomiting - persistent -- Extreme swelling or bruising -- Continued bleeding from incision -- Increased pain, redness, or drainage from the incision -- Difficulty swallowing or breathing -- Muscle cramping or spasms -- Numbness or tingling in hands or around lips  The clinic staff is available to answer your questions during regular business hours.  Please don't hesitate to call and ask to speak to one of the nurses if you have concerns.  Armandina Gemma, MD [facility] Office: [phone]   Ice pack   Complete by:  As directed    Increase activity slowly   Complete by:  As directed    Remove dressing in 24 hours   Complete by:  As directed      Allergies as of 03/31/2018   No Known Allergies     Medication List    TAKE these medications   acetaminophen 500 MG tablet Commonly known as:  TYLENOL Take 1,000 mg by mouth as needed.   calcitRIOL 0.25 MCG capsule Commonly known as:  ROCALTROL Take 1 capsule (0.25 mcg total) by mouth 2 (two) times daily.   calcium carbonate 500 MG chewable tablet Commonly known as:  TUMS - dosed in mg elemental calcium Chew 2 tablets (400 mg of elemental calcium total) by mouth 3 (three) times daily.   HYDROcodone-acetaminophen 5-325 MG tablet Commonly known as:  NORCO/VICODIN Take 1-2 tablets by mouth every 4 (four) hours as needed for moderate pain.   levothyroxine 100 MCG tablet Commonly known as:  SYNTHROID, LEVOTHROID Take 1 tablet (100 mcg total) by mouth daily.   loratadine 10 MG tablet Commonly known as:  CLARITIN Take 10 mg by mouth every morning.   Melatonin 1 MG Tabs Take 6 mg by mouth at bedtime.   vitamin C 1000 MG tablet Take 2,000 mg by mouth every morning.        Earnstine Regal, MD, [facility] Office: [phone]   Signed: Earnstine Regal 03/31/2018, 8:59 AM

## 2018-03-31 NOTE — Progress Notes (Signed)
Discharge instructions given to pt and all questions were answered. Pt taken down via wheelchair and was picked up by his mom.

## 2018-04-01 DIAGNOSIS — E890 Postprocedural hypothyroidism: Secondary | ICD-10-CM | POA: Diagnosis not present

## 2018-04-06 ENCOUNTER — Other Ambulatory Visit: Payer: Self-pay | Admitting: Internal Medicine

## 2018-04-06 DIAGNOSIS — E892 Postprocedural hypoparathyroidism: Secondary | ICD-10-CM | POA: Diagnosis not present

## 2018-04-06 DIAGNOSIS — C73 Malignant neoplasm of thyroid gland: Secondary | ICD-10-CM | POA: Diagnosis not present

## 2018-04-06 DIAGNOSIS — E890 Postprocedural hypothyroidism: Secondary | ICD-10-CM | POA: Diagnosis not present

## 2018-04-20 DIAGNOSIS — C73 Malignant neoplasm of thyroid gland: Secondary | ICD-10-CM | POA: Diagnosis not present

## 2018-04-29 ENCOUNTER — Ambulatory Visit
Admission: RE | Admit: 2018-04-29 | Discharge: 2018-04-29 | Disposition: A | Discharge status: Home / Self Care | Payer: BLUE CROSS/BLUE SHIELD | Source: Ambulatory Visit | Attending: Internal Medicine | Admitting: Internal Medicine

## 2018-04-29 DIAGNOSIS — C73 Malignant neoplasm of thyroid gland: Secondary | ICD-10-CM

## 2018-04-29 DIAGNOSIS — E890 Postprocedural hypothyroidism: Secondary | ICD-10-CM | POA: Diagnosis not present

## 2018-05-06 DIAGNOSIS — M545 Low back pain: Secondary | ICD-10-CM | POA: Diagnosis not present

## 2018-05-06 DIAGNOSIS — M9903 Segmental and somatic dysfunction of lumbar region: Secondary | ICD-10-CM | POA: Diagnosis not present

## 2018-05-06 DIAGNOSIS — M9905 Segmental and somatic dysfunction of pelvic region: Secondary | ICD-10-CM | POA: Diagnosis not present

## 2018-05-06 DIAGNOSIS — M9904 Segmental and somatic dysfunction of sacral region: Secondary | ICD-10-CM | POA: Diagnosis not present

## 2018-05-12 DIAGNOSIS — M9904 Segmental and somatic dysfunction of sacral region: Secondary | ICD-10-CM | POA: Diagnosis not present

## 2018-05-12 DIAGNOSIS — M545 Low back pain: Secondary | ICD-10-CM | POA: Diagnosis not present

## 2018-05-12 DIAGNOSIS — M9905 Segmental and somatic dysfunction of pelvic region: Secondary | ICD-10-CM | POA: Diagnosis not present

## 2018-05-12 DIAGNOSIS — M9903 Segmental and somatic dysfunction of lumbar region: Secondary | ICD-10-CM | POA: Diagnosis not present

## 2018-06-08 DIAGNOSIS — E892 Postprocedural hypoparathyroidism: Secondary | ICD-10-CM | POA: Diagnosis not present

## 2018-06-08 DIAGNOSIS — C73 Malignant neoplasm of thyroid gland: Secondary | ICD-10-CM | POA: Diagnosis not present

## 2018-06-08 DIAGNOSIS — E063 Autoimmune thyroiditis: Secondary | ICD-10-CM | POA: Diagnosis not present

## 2018-06-08 DIAGNOSIS — E890 Postprocedural hypothyroidism: Secondary | ICD-10-CM | POA: Diagnosis not present

## 2018-07-01 DIAGNOSIS — Z111 Encounter for screening for respiratory tuberculosis: Secondary | ICD-10-CM | POA: Diagnosis not present

## 2018-07-01 DIAGNOSIS — Z23 Encounter for immunization: Secondary | ICD-10-CM | POA: Diagnosis not present

## 2018-07-22 DIAGNOSIS — C73 Malignant neoplasm of thyroid gland: Secondary | ICD-10-CM | POA: Diagnosis not present

## 2018-11-20 DIAGNOSIS — Z9889 Other specified postprocedural states: Secondary | ICD-10-CM | POA: Diagnosis not present

## 2018-11-20 DIAGNOSIS — C73 Malignant neoplasm of thyroid gland: Secondary | ICD-10-CM | POA: Diagnosis not present

## 2018-12-01 DIAGNOSIS — E892 Postprocedural hypoparathyroidism: Secondary | ICD-10-CM | POA: Diagnosis not present

## 2018-12-01 DIAGNOSIS — E890 Postprocedural hypothyroidism: Secondary | ICD-10-CM | POA: Diagnosis not present

## 2018-12-01 DIAGNOSIS — C73 Malignant neoplasm of thyroid gland: Secondary | ICD-10-CM | POA: Diagnosis not present

## 2018-12-15 DIAGNOSIS — C73 Malignant neoplasm of thyroid gland: Secondary | ICD-10-CM | POA: Diagnosis not present

## 2018-12-15 DIAGNOSIS — E890 Postprocedural hypothyroidism: Secondary | ICD-10-CM | POA: Diagnosis not present

## 2018-12-15 DIAGNOSIS — E063 Autoimmune thyroiditis: Secondary | ICD-10-CM | POA: Diagnosis not present

## 2019-03-10 IMAGING — DX DG CHEST 2V
2 series · 2 of 2 positions shown · non-contrast
Comparison: None.

CLINICAL DATA: History of papillary thyroid carcinoma. Preoperative
exam prior to thyroidectomy. Current smoker.

EXAM:
CHEST - 2 VIEW

[chest pa]
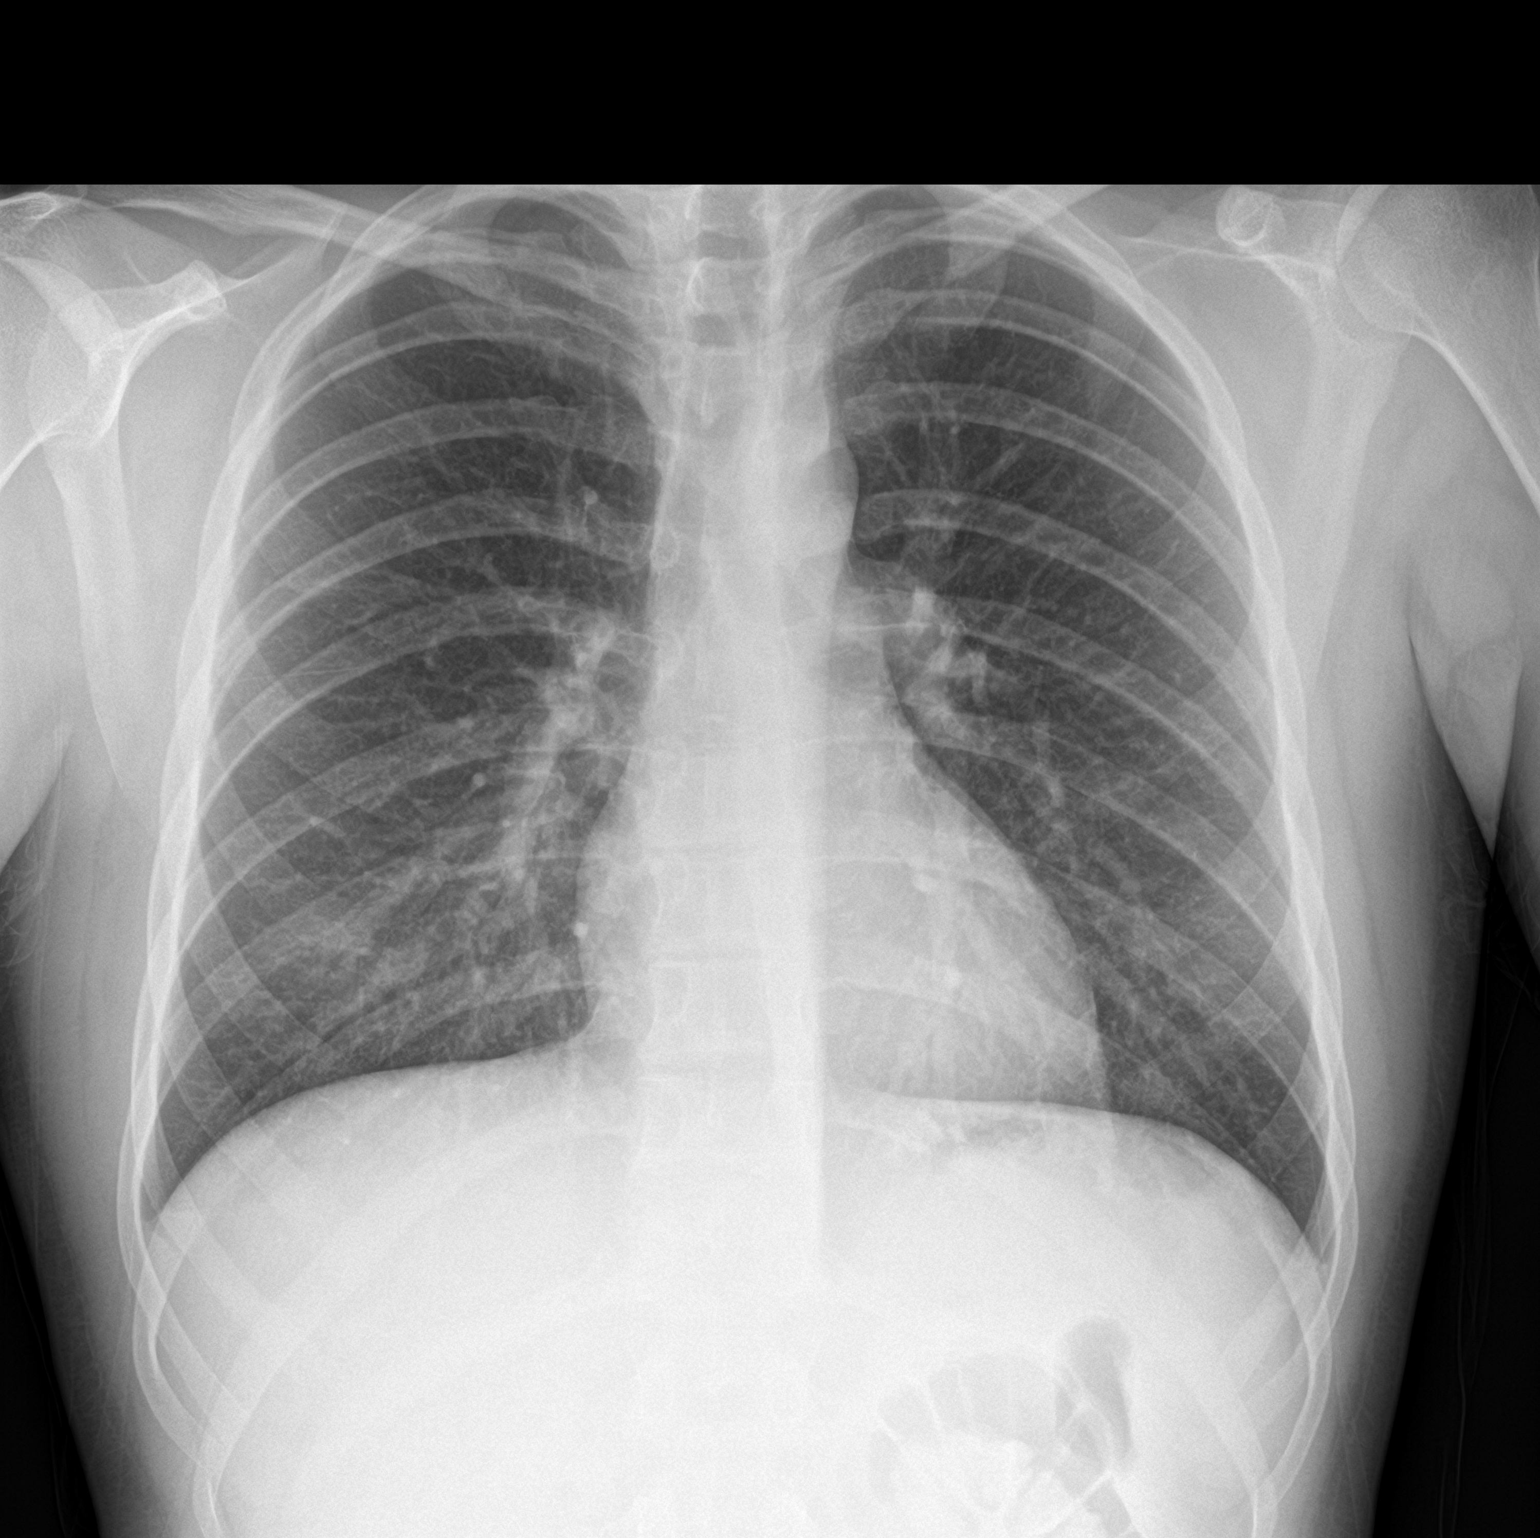

[chest lat]
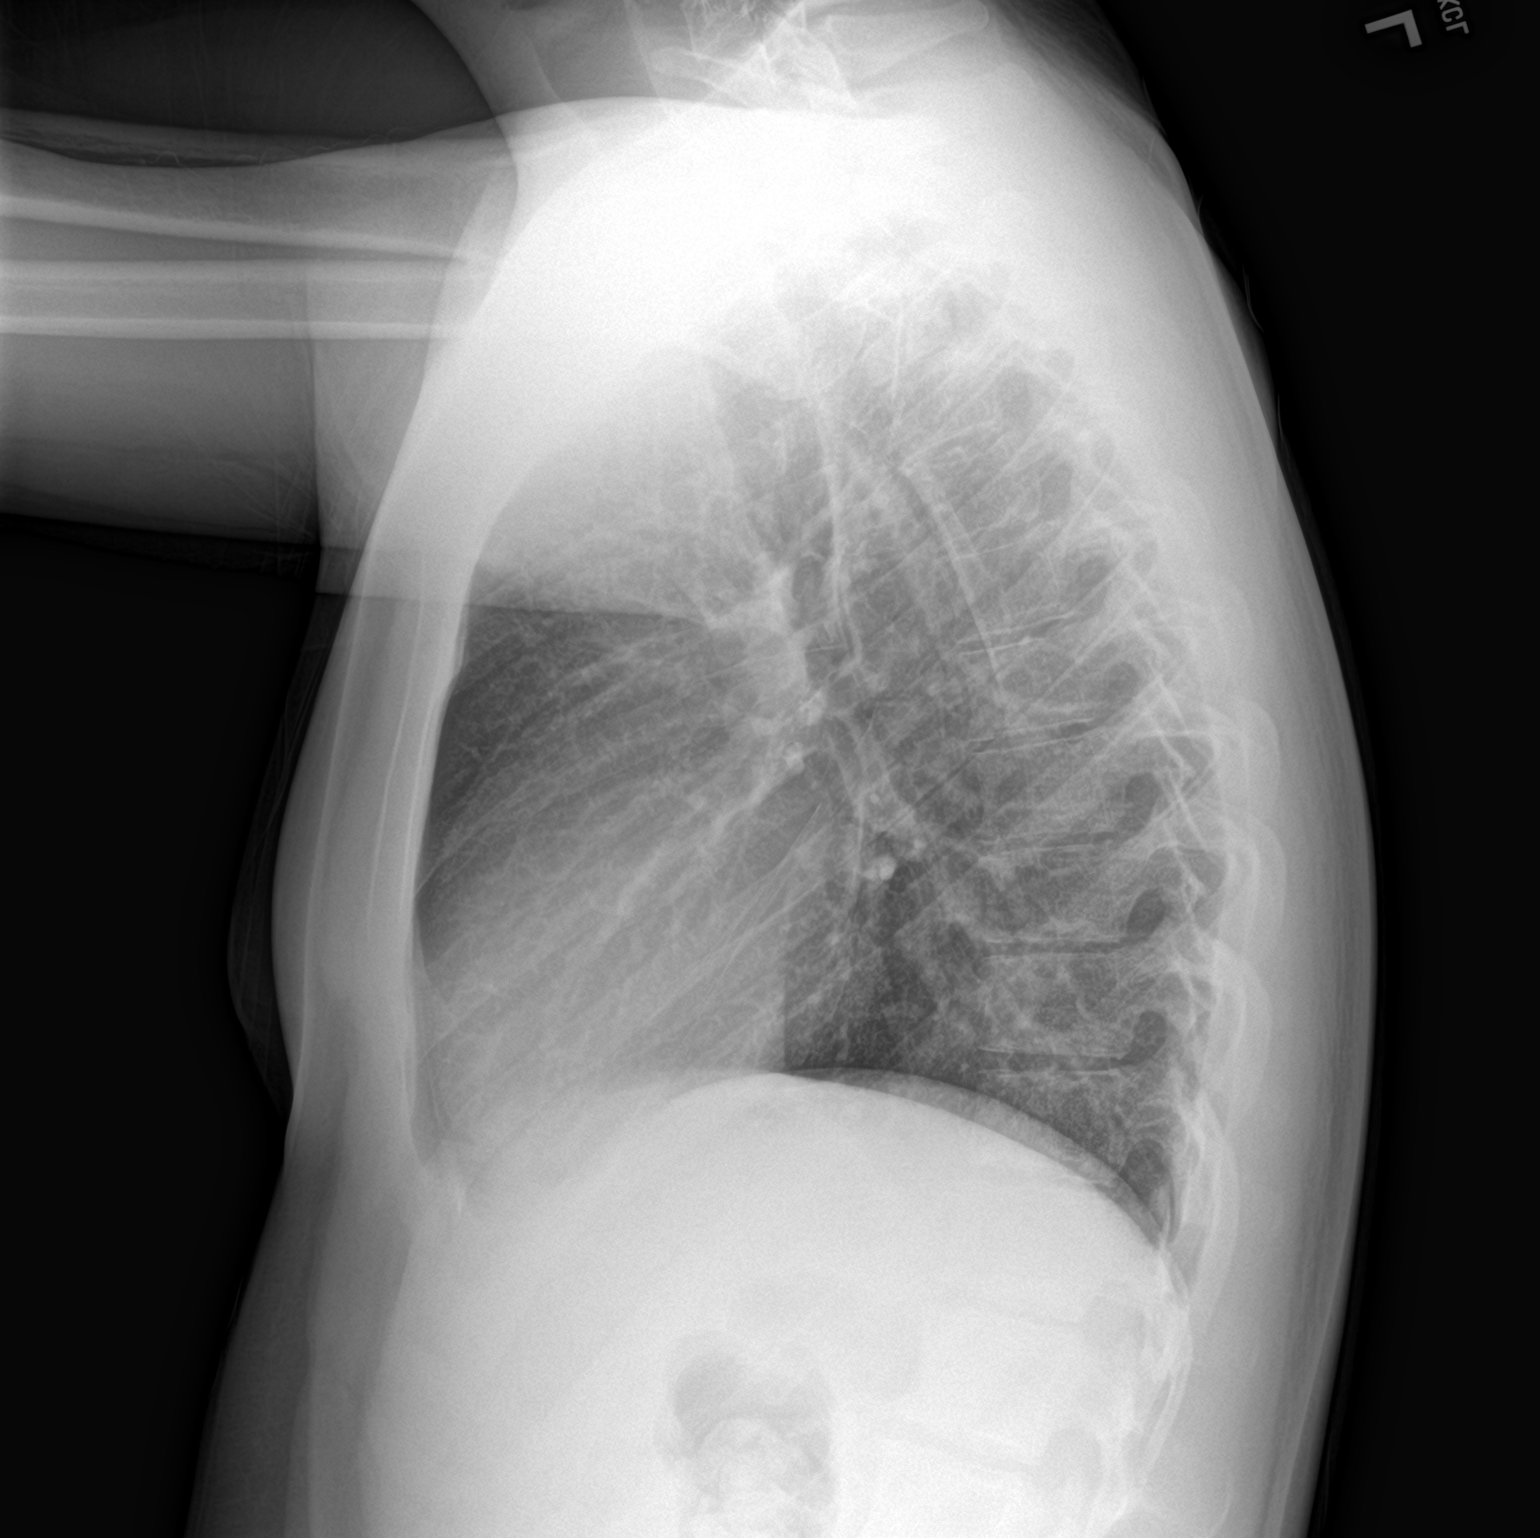

[2 of 2 positions shown; findings below may reference images not displayed]

FINDINGS: The lungs are adequately inflated. There is no focal infiltrate.
There is no pleural effusion. The heart and pulmonary vascularity
are normal. The mediastinum is normal in width. The trachea is
midline. The bony thorax exhibits no acute abnormality.
IMPRESSION: There is no active cardiopulmonary disease.

## 2020-01-28 IMAGING — US US FNA BIOPSY THYROID 1ST LESION
1 series · 13 of 15 positions shown · non-contrast
Comparison: US THYROID 01/15/2018

MEDICATIONS:
2 mL 1% lidocaine

COMPLICATIONS:
None immediate.

INDICATION: Indeterminate thyroid nodule of the left inferior thyroid. Request
is made for fine-needle aspiration of indeterminate thyroid nodule.

EXAM:
ULTRASOUND GUIDED FINE NEEDLE ASPIRATION OF INDETERMINATE THYROID
NODULE
TECHNIQUE: Informed written consent was obtained from the patient after a
discussion of the risks, benefits and alternatives to treatment.
Questions regarding the procedure were encouraged and answered. A
timeout was performed prior to the initiation of the procedure.

[Series 1: us fna biopsy thyroid 1st lesion · 0.06mm/px · 15 acquisitions, 13 frames shown]
[im 1/15]
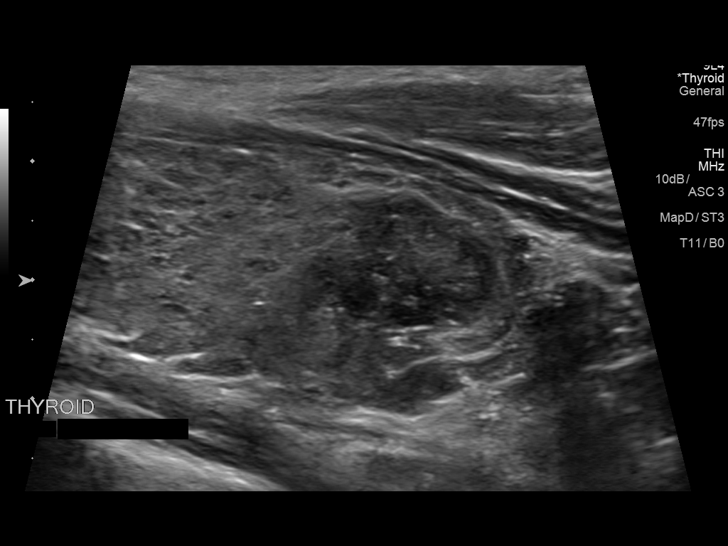
[im 2/15]
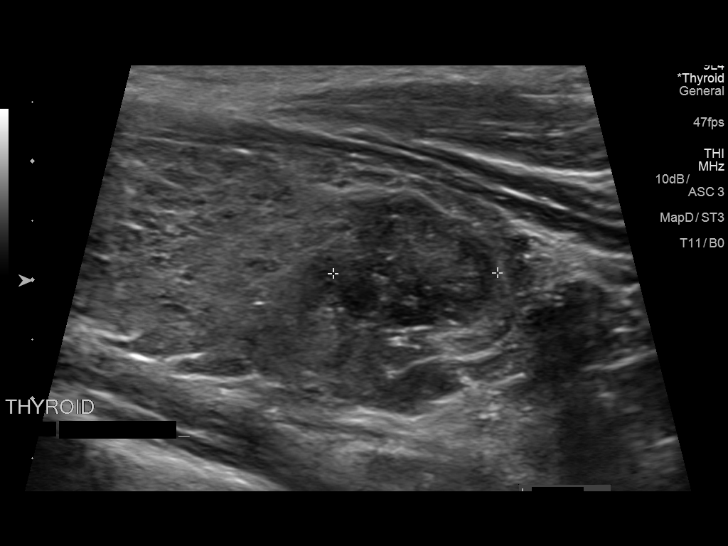
[im 3/15]
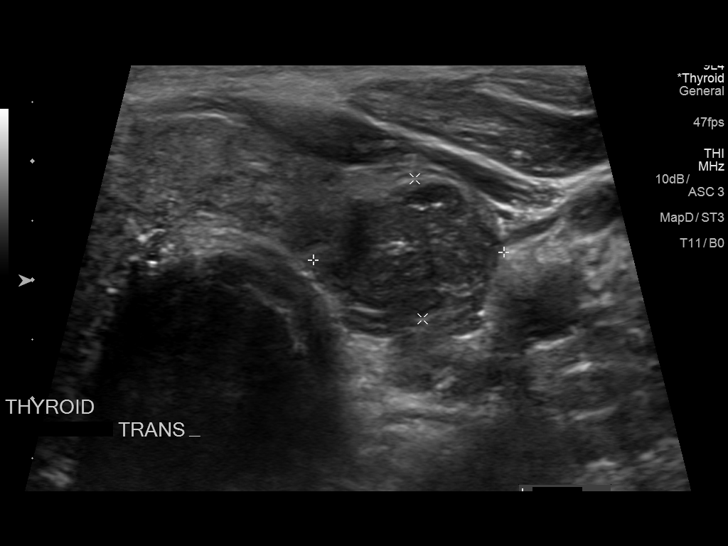
[im 5/15]
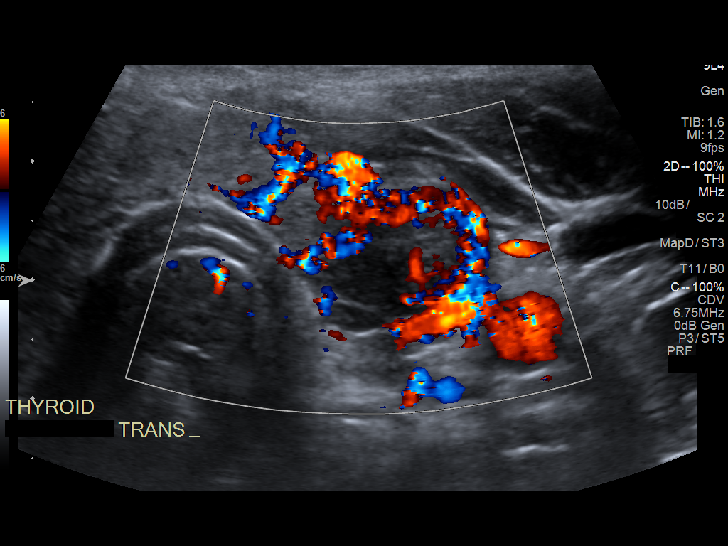
[im 6/15]
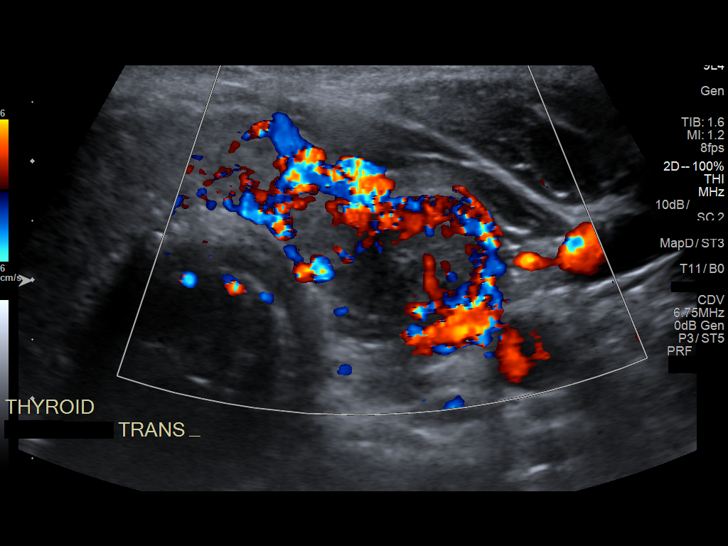
[im 7/15]
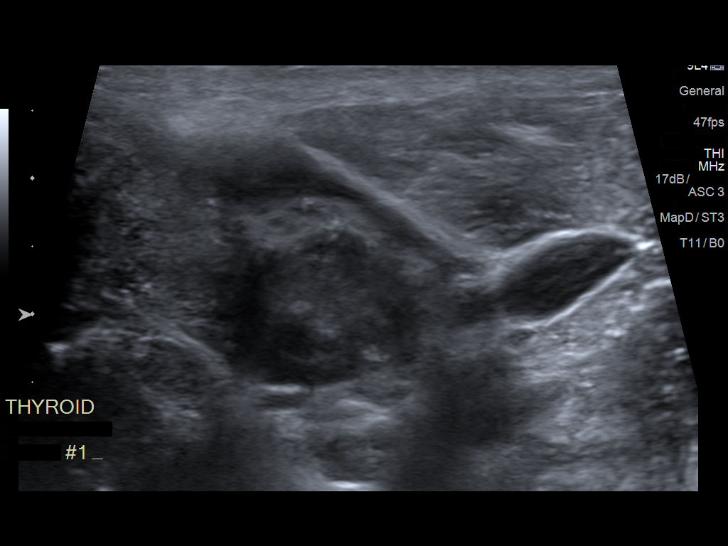
[im 8/15]
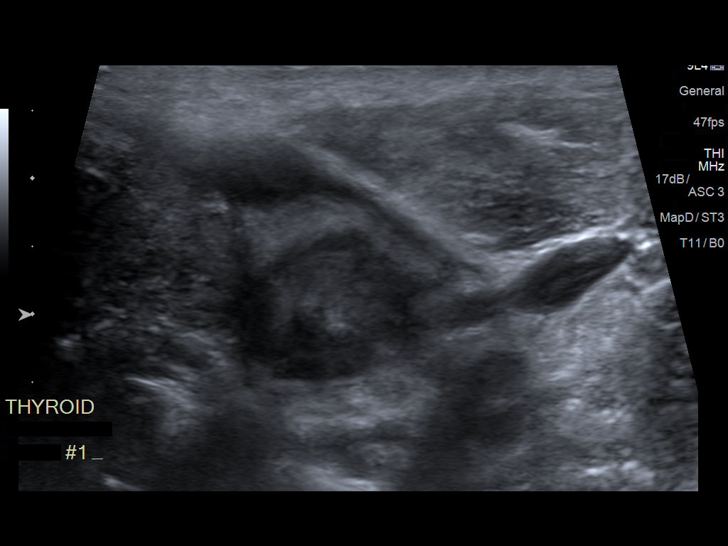
[im 9/15]
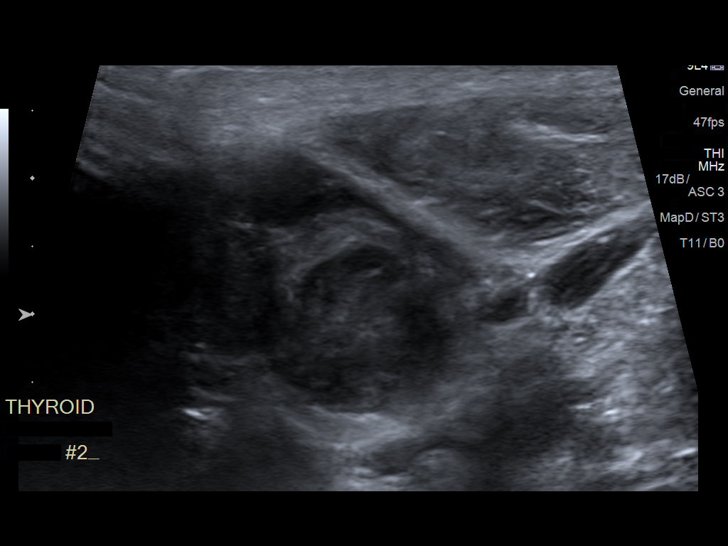
[im 10/15]
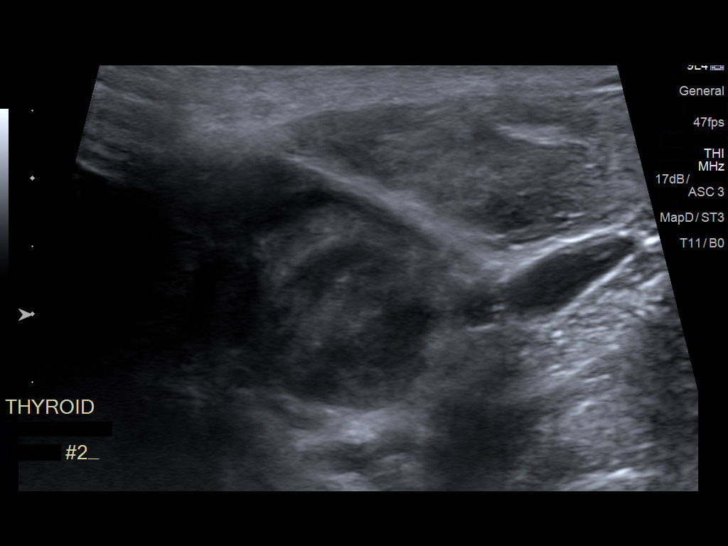
[im 11/15]
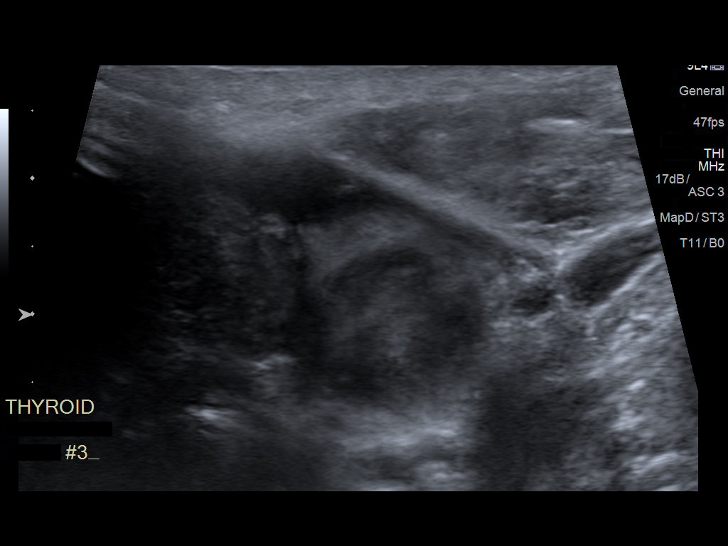
[im 13/15]
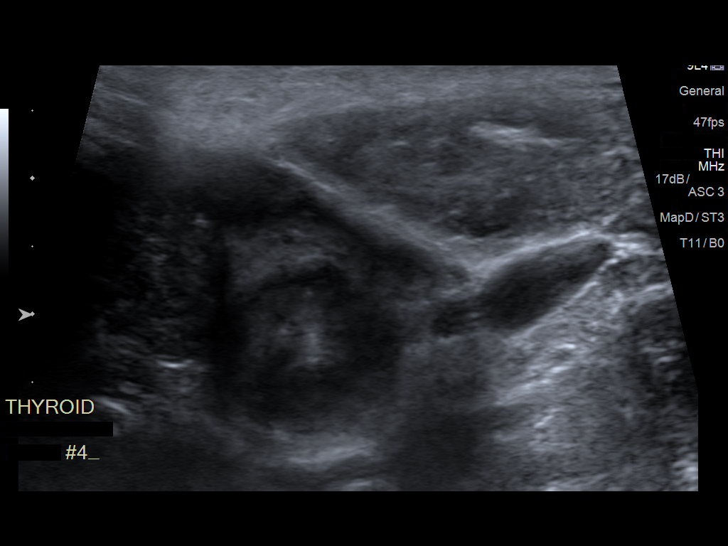
[im 14/15]
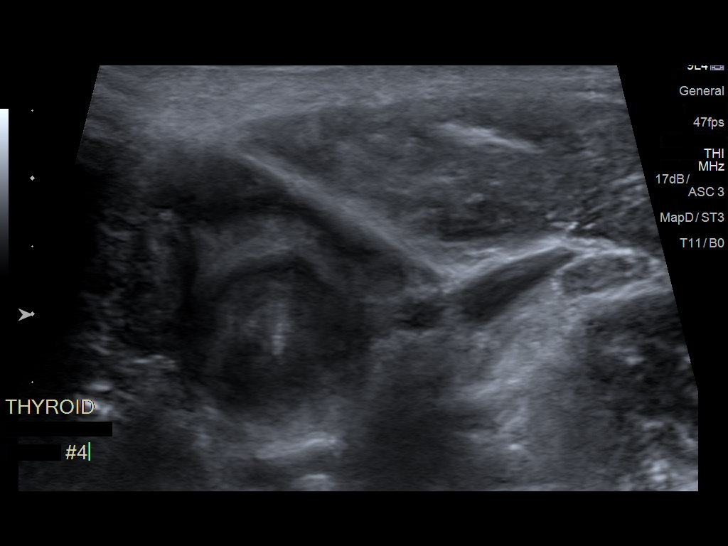
[im 15/15]
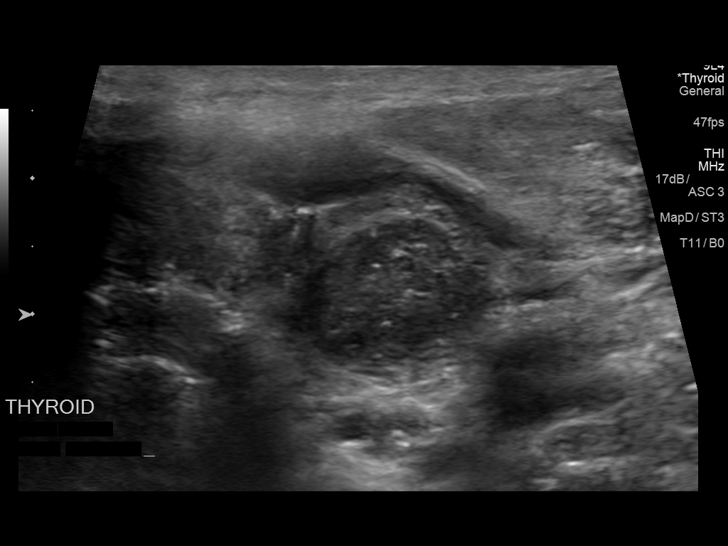

[13 of 15 positions shown; findings below may reference images not displayed]

Pre-procedural ultrasound scanning demonstrated unchanged size and
appearance of the indeterminate nodule within the left inferior
thyroid.

The procedure was planned. The neck was prepped in the usual sterile
fashion, and a sterile drape was applied covering the operative
field. A timeout was performed prior to the initiation of the
procedure. Local anesthesia was provided with 1% lidocaine.

Under direct ultrasound guidance, 4 FNA biopsies were performed of
the left inferior thyroid nodule with a 25 gauge needle. Multiple
ultrasound images were saved for procedural documentation purposes.
The samples were prepared and submitted to pathology.

Limited post procedural scanning was negative for hematoma or
additional complication. Dressings were placed. The patient
tolerated the above procedures procedure well without immediate
postprocedural complication.
FINDINGS: Nodule reference number based on prior diagnostic ultrasound: 1

Maximum size: 1.4 cm

Location: Left; Inferior

ACR TI-RADS risk category: TR5 (>/= 7 points)

Reason for biopsy: meets ACR TI-RADS criteria

Ultrasound imaging confirms appropriate placement of the needles
within the thyroid nodule.
IMPRESSION: Technically successful ultrasound guided fine needle aspiration of
indeterminate nodule of the left inferior thyroid.

## 2020-04-29 IMAGING — US US THYROID
1 series · 14 of 25 positions shown · non-contrast
Comparison: 01/15/2018

CLINICAL DATA: Other. Papillary thyroid carcinoma. Status post
total thyroidectomy.

EXAM:
THYROID ULTRASOUND
TECHNIQUE: Ultrasound examination of the thyroid gland and adjacent soft
tissues was performed.

[Series 1: us thyroid · 0.07mm/px · 14 of 26 slices shown]
[im 1/26]
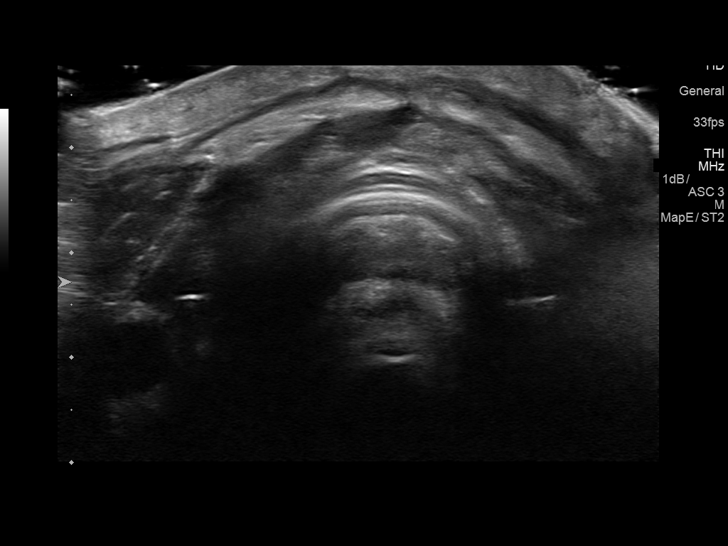
[im 3/26]
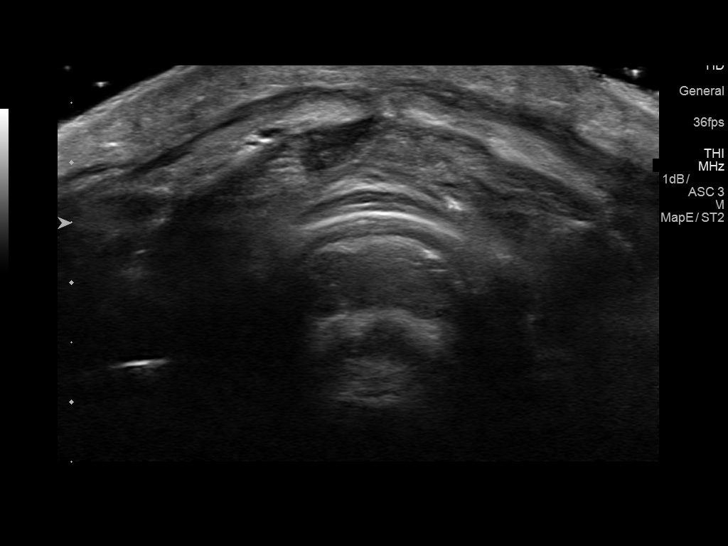
[im 5/26]
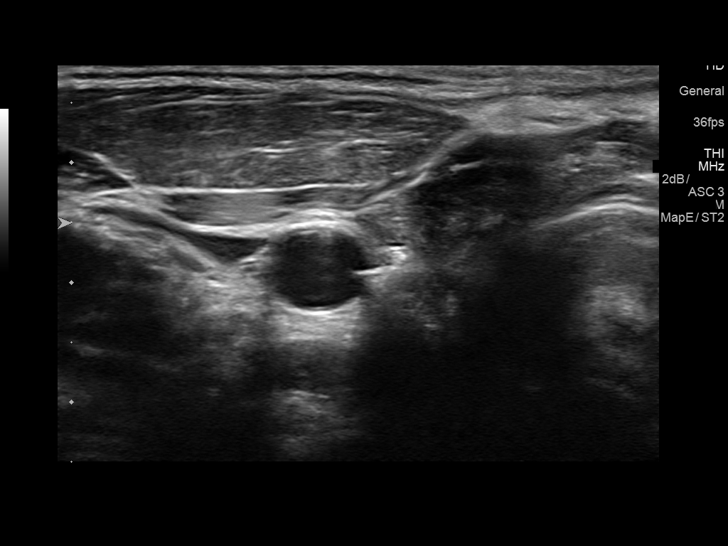
[im 7/26]
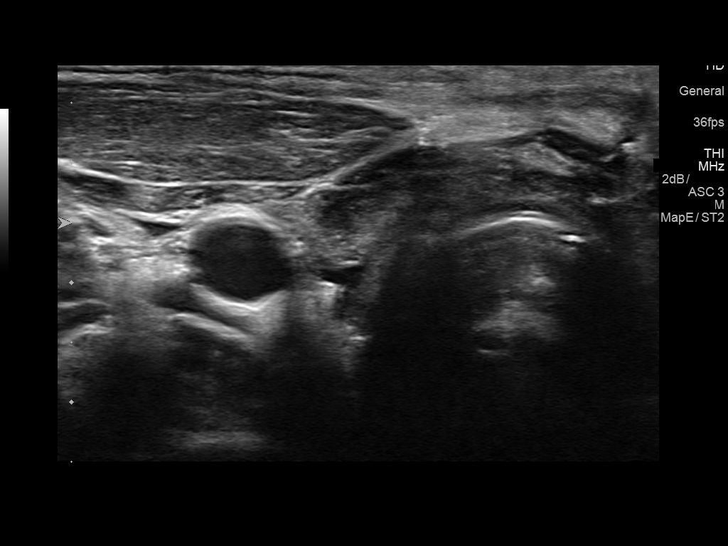
[im 9/26]
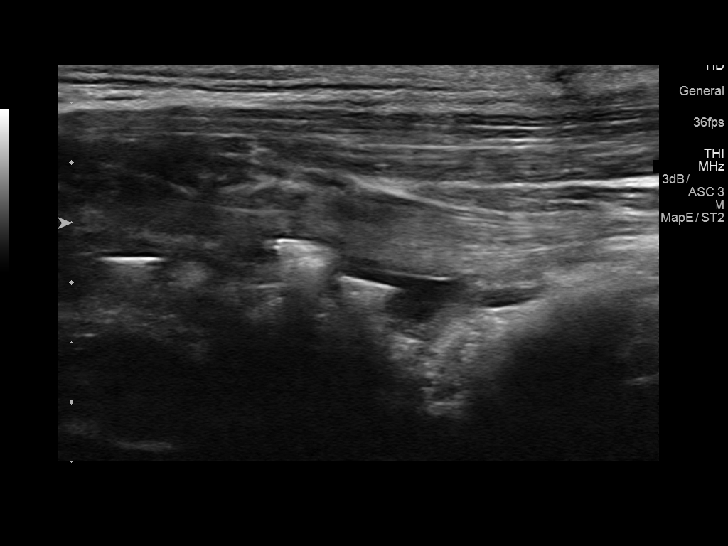
[im 10/26]
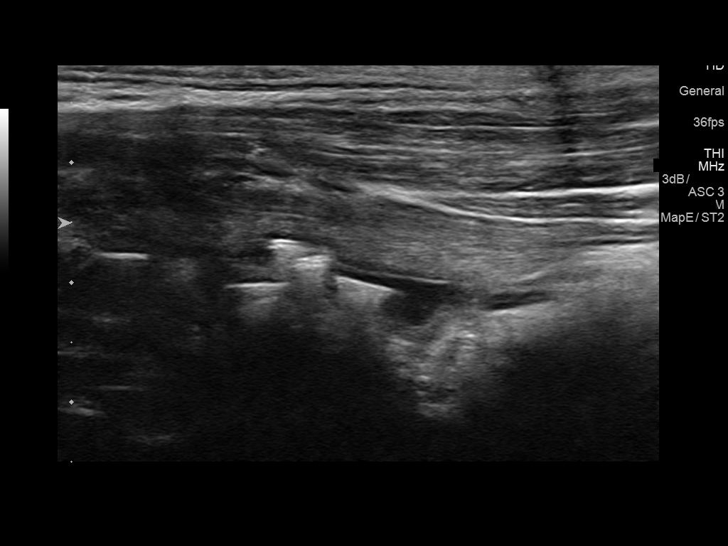
[im 12/26]
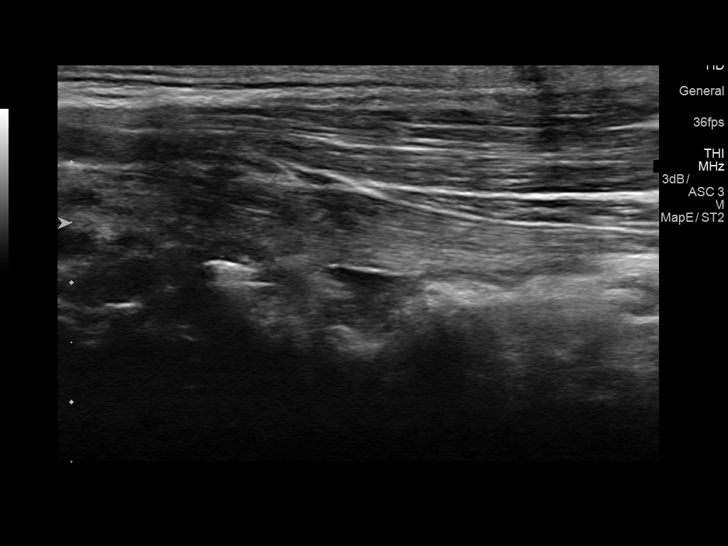
[im 14/26]
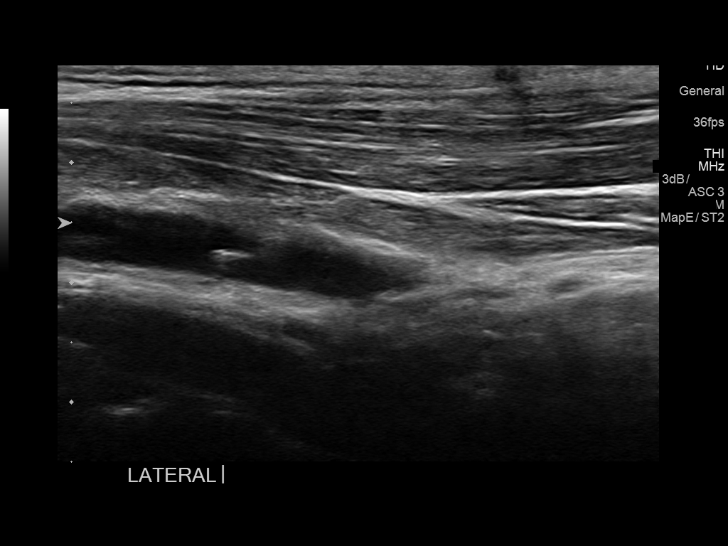
[im 16/26]
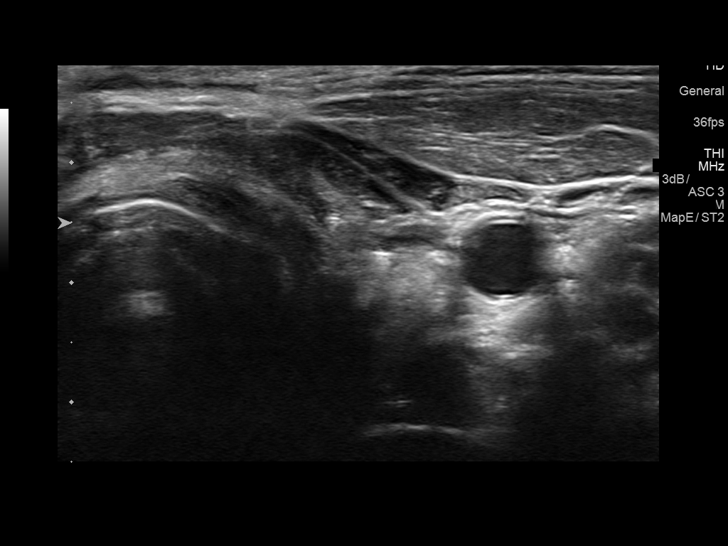
[im 17/26]
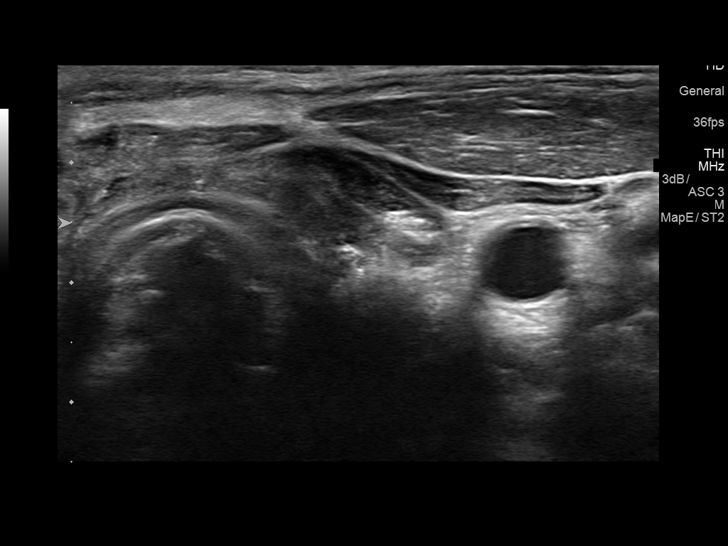
[im 19/26]
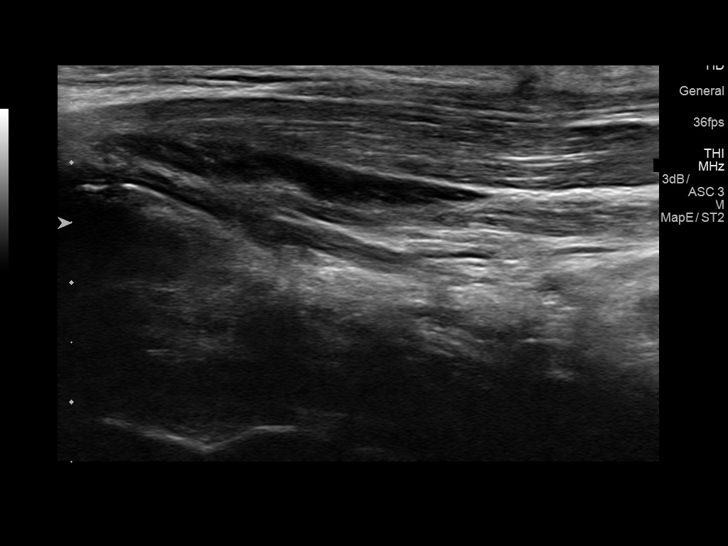
[im 21/26]
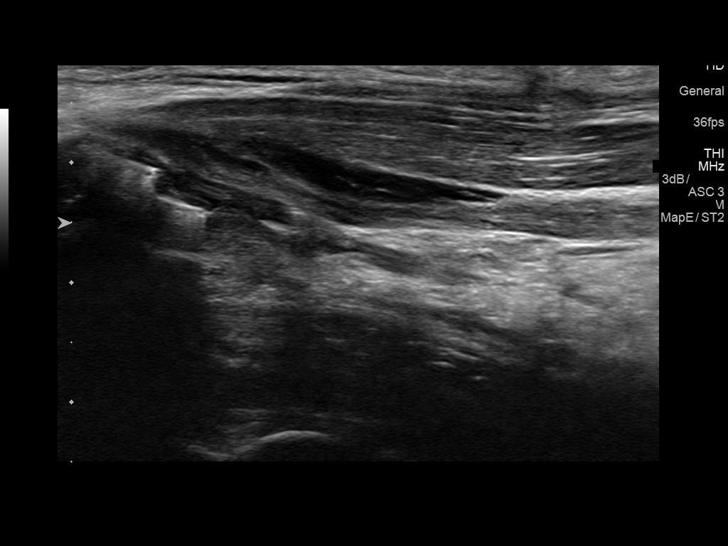
[im 23/26]
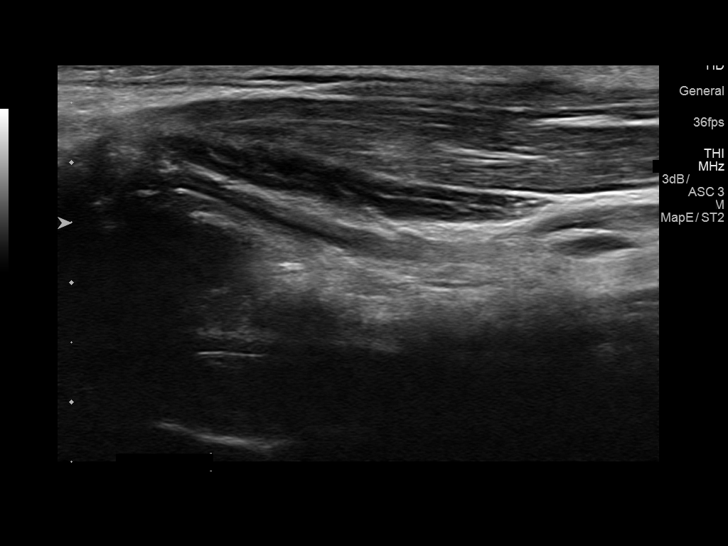
[im 26/26]
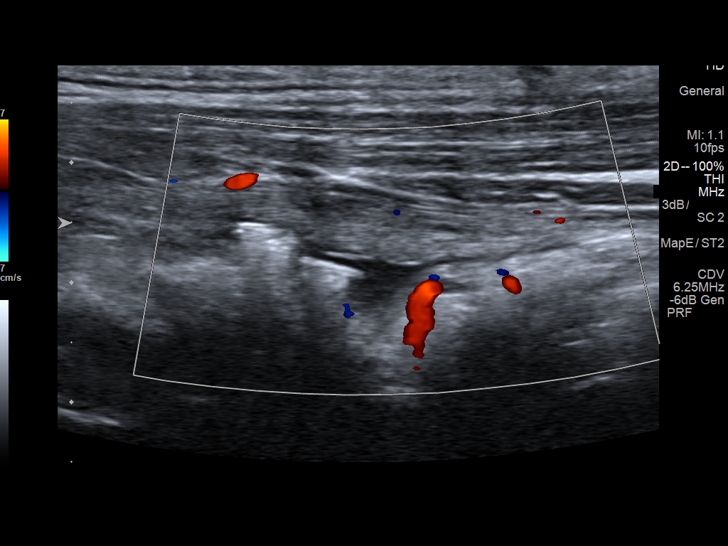

[14 of 25 positions shown; findings below may reference images not displayed]

FINDINGS: There is no residual tissue in the right or left thyroid beds. There
is a small amount of fluid in the right thyroid bed which is most
likely postoperative. There is no evidence of abnormal adenopathy.
IMPRESSION: There is no residual tissue in the right or left thyroid beds to
suggest recurrent or residual tissue. A small amount of fluid in the
right thyroid bed is likely postoperative.

The above is in keeping with the ACR TI-RADS recommendations - [HOSPITAL] 7626;[DATE].
